# Patient Record
Sex: Female | Born: 1965 | Race: White | Hispanic: No | Marital: Married | Smoking: Never smoker
Health system: Southern US, Community
[De-identification: ages and names within clinical notes are randomized; demographics above are authoritative.]

## PROBLEM LIST (undated history)

## (undated) DIAGNOSIS — R519 Headache, unspecified: Secondary | ICD-10-CM

## (undated) DIAGNOSIS — Z9889 Other specified postprocedural states: Secondary | ICD-10-CM

## (undated) DIAGNOSIS — H348192 Central retinal vein occlusion, unspecified eye, stable: Secondary | ICD-10-CM

## (undated) DIAGNOSIS — R112 Nausea with vomiting, unspecified: Secondary | ICD-10-CM

## (undated) DIAGNOSIS — D649 Anemia, unspecified: Secondary | ICD-10-CM

## (undated) DIAGNOSIS — R51 Headache: Secondary | ICD-10-CM

## (undated) HISTORY — PX: TUBAL LIGATION: SHX77

## (undated) HISTORY — PX: KNEE SURGERY: SHX244

## (undated) HISTORY — PX: DILATION AND CURETTAGE OF UTERUS: SHX78

## (undated) HISTORY — PX: COSMETIC SURGERY: SHX468

## (undated) HISTORY — PX: BREAST SURGERY: SHX581

---

## 2002-09-02 ENCOUNTER — Ambulatory Visit (HOSPITAL_COMMUNITY): Admission: RE | Admit: 2002-09-02 | Discharge: 2002-09-02 | Payer: Self-pay | Admitting: Physical Therapy

## 2002-10-30 ENCOUNTER — Other Ambulatory Visit: Admission: RE | Admit: 2002-10-30 | Discharge: 2002-10-30 | Payer: Self-pay | Admitting: Obstetrics and Gynecology

## 2003-06-02 ENCOUNTER — Other Ambulatory Visit: Admission: RE | Admit: 2003-06-02 | Discharge: 2003-06-02 | Payer: Self-pay | Admitting: Obstetrics and Gynecology

## 2003-10-05 ENCOUNTER — Encounter: Admission: RE | Admit: 2003-10-05 | Discharge: 2003-10-05 | Payer: Self-pay | Admitting: Obstetrics and Gynecology

## 2003-12-11 ENCOUNTER — Inpatient Hospital Stay (HOSPITAL_COMMUNITY): Admission: AD | Admit: 2003-12-11 | Discharge: 2003-12-11 | Payer: Self-pay | Admitting: Obstetrics & Gynecology

## 2003-12-20 ENCOUNTER — Inpatient Hospital Stay (HOSPITAL_COMMUNITY): Admission: RE | Admit: 2003-12-20 | Discharge: 2003-12-23 | Payer: Self-pay | Admitting: Obstetrics and Gynecology

## 2004-01-09 DIAGNOSIS — J189 Pneumonia, unspecified organism: Secondary | ICD-10-CM

## 2004-01-09 HISTORY — DX: Pneumonia, unspecified organism: J18.9

## 2004-01-31 ENCOUNTER — Other Ambulatory Visit: Admission: RE | Admit: 2004-01-31 | Discharge: 2004-01-31 | Payer: Self-pay | Admitting: Obstetrics and Gynecology

## 2004-03-04 ENCOUNTER — Emergency Department (HOSPITAL_COMMUNITY): Admission: EM | Admit: 2004-03-04 | Discharge: 2004-03-04 | Payer: Self-pay | Admitting: Emergency Medicine

## 2005-02-16 ENCOUNTER — Other Ambulatory Visit: Admission: RE | Admit: 2005-02-16 | Discharge: 2005-02-16 | Payer: Self-pay | Admitting: Obstetrics and Gynecology

## 2016-04-21 ENCOUNTER — Emergency Department (HOSPITAL_COMMUNITY): Payer: BLUE CROSS/BLUE SHIELD

## 2016-04-21 ENCOUNTER — Emergency Department (HOSPITAL_COMMUNITY)
Admission: EM | Admit: 2016-04-21 | Discharge: 2016-04-21 | Disposition: A | Discharge status: Home / Self Care | Payer: BLUE CROSS/BLUE SHIELD | Attending: Emergency Medicine | Admitting: Emergency Medicine

## 2016-04-21 ENCOUNTER — Encounter (HOSPITAL_COMMUNITY): Payer: Self-pay | Admitting: *Deleted

## 2016-04-21 DIAGNOSIS — R1030 Lower abdominal pain, unspecified: Secondary | ICD-10-CM | POA: Diagnosis present

## 2016-04-21 DIAGNOSIS — N83201 Unspecified ovarian cyst, right side: Secondary | ICD-10-CM | POA: Diagnosis not present

## 2016-04-21 LAB — COMPREHENSIVE METABOLIC PANEL
ALBUMIN: 3.4 g/dL — AB (ref 3.5–5.0)
ALK PHOS: 79 U/L (ref 38–126)
ALT: 20 U/L (ref 14–54)
AST: 23 U/L (ref 15–41)
Anion gap: 8 (ref 5–15)
BILIRUBIN TOTAL: 0.3 mg/dL (ref 0.3–1.2)
BUN: 10 mg/dL (ref 6–20)
CALCIUM: 8.2 mg/dL — AB (ref 8.9–10.3)
CO2: 24 mmol/L (ref 22–32)
CREATININE: 0.93 mg/dL (ref 0.44–1.00)
Chloride: 104 mmol/L (ref 101–111)
GFR calc Af Amer: >60 mL/min (ref >60)
GFR calc non Af Amer: >60 mL/min (ref >60)
GLUCOSE: 136 mg/dL — AB (ref 65–99)
Potassium: 3.2 mmol/L — ABNORMAL LOW (ref 3.5–5.1)
SODIUM: 136 mmol/L (ref 135–145)
TOTAL PROTEIN: 6.2 g/dL — AB (ref 6.5–8.1)

## 2016-04-21 LAB — URINALYSIS, ROUTINE W REFLEX MICROSCOPIC
Bilirubin Urine: NEGATIVE
GLUCOSE, UA: NEGATIVE mg/dL
Ketones, ur: NEGATIVE mg/dL
Nitrite: NEGATIVE
PROTEIN: NEGATIVE mg/dL
SPECIFIC GRAVITY, URINE: 1.018 (ref 1.005–1.030)
pH: 5 (ref 5.0–8.0)

## 2016-04-21 LAB — LIPASE, BLOOD: Lipase: 34 U/L (ref 11–51)

## 2016-04-21 LAB — CBC
HCT: 35.5 % — ABNORMAL LOW (ref 36.0–46.0)
Hemoglobin: 11.2 g/dL — ABNORMAL LOW (ref 12.0–15.0)
MCH: 27.3 pg (ref 26.0–34.0)
MCHC: 31.5 g/dL (ref 30.0–36.0)
MCV: 86.4 fL (ref 78.0–100.0)
PLATELETS: 393 10*3/uL (ref 150–400)
RBC: 4.11 MIL/uL (ref 3.87–5.11)
RDW: 16 % — AB (ref 11.5–15.5)
WBC: 14.4 10*3/uL — ABNORMAL HIGH (ref 4.0–10.5)

## 2016-04-21 LAB — PREGNANCY, URINE: Preg Test, Ur: NEGATIVE

## 2016-04-21 MED ORDER — POTASSIUM CHLORIDE CRYS ER 20 MEQ PO TBCR
40.0000 meq | EXTENDED_RELEASE_TABLET | Freq: Once | ORAL | Status: AC
  Administered 2016-04-21: 40 meq via ORAL
  Filled 2016-04-21: qty 2

## 2016-04-21 MED ORDER — ONDANSETRON 4 MG PO TBDP
ORAL_TABLET | ORAL | Status: AC
  Filled 2016-04-21: qty 1

## 2016-04-21 MED ORDER — OXYCODONE-ACETAMINOPHEN 5-325 MG PO TABS
ORAL_TABLET | ORAL | Status: AC
  Filled 2016-04-21: qty 1

## 2016-04-21 MED ORDER — OXYCODONE-ACETAMINOPHEN 5-325 MG PO TABS
1.0000 | ORAL_TABLET | ORAL | Status: DC | PRN
  Administered 2016-04-21: 1 via ORAL

## 2016-04-21 MED ORDER — SODIUM CHLORIDE 0.9 % IV BOLUS (SEPSIS)
1000.0000 mL | Freq: Once | INTRAVENOUS | Status: AC
  Administered 2016-04-21: 1000 mL via INTRAVENOUS

## 2016-04-21 MED ORDER — OXYCODONE-ACETAMINOPHEN 5-325 MG PO TABS: 1.0000 | ORAL_TABLET | Freq: Four times a day (QID) | ORAL | 0 refills | Status: DC | PRN

## 2016-04-21 MED ORDER — ONDANSETRON 4 MG PO TBDP
4.0000 mg | ORAL_TABLET | Freq: Once | ORAL | Status: AC
  Administered 2016-04-21: 4 mg via ORAL

## 2016-04-21 NOTE — ED Notes (Signed)
Spoke with laboratory regarding add on pregnancy urine test. Laboratory personnel indicated that they have received the request and are processing now.

## 2016-04-21 NOTE — Discharge Instructions (Signed)
Your Ultrasound shows a 14 cm cyst on your RIGHT ovary. You need an outpatient MRI of your pelvis to better clarify what this is. Your Family doctor or OBGYN can order this. If you develop recurrent or worsening pain in your abdomen, come back to the ER for evaluation

## 2016-04-21 NOTE — ED Provider Notes (Signed)
[facility] DEPT Provider Note   CSN: [id number] Arrival date & time: 04/21/16  0025   By signing my name below, I, Soijett Blue, attest that this documentation has been prepared under the direction and in the presence of Sherwood Gambler, MD. Electronically Signed: Soijett Blue, ED Scribe. 04/21/16. 1:49 AM.  History   Chief Complaint Chief Complaint  Patient presents with  . Abdominal Pain    HPI Kendra Gutierrez is a 51 y.o. female who presents to the Emergency Department complaining of sudden onset, progressively worsening, intermittent, lower abdominal pain since 6:30 PM last night. She notes that her lower abdominal pain radiates to her right groin and her back. Pt states that her lower abdominal pain is alleviated with keeping her right leg bent at a 90 degree angle. Pt reports associated resolved nausea. Pt has not tried any medications for the relief of her symptoms. She states that she ate dinner and felt an urge to use the bathroom with no success prior to the onset of her symptoms. She denies vomiting, diarrhea, constipation, dysuria, hematuria, difficulty urinating, fever, cough, SOB, flank pain, and any other symptoms. Denies past abdominal or gallbladder issues in the past. Pt states that her LMP was 1 month ago and she has had a tubal ligation.      The history is provided by the patient. No language interpreter was used.    History reviewed. No pertinent past medical history.  There are no active problems to display for this patient.   Past Surgical History:  Procedure Laterality Date  . CESAREAN SECTION    . COSMETIC SURGERY      OB History    No data available       Home Medications    Prior to Admission medications   Medication Sig Start Date End Date Taking? Authorizing Provider  oxyCODONE-acetaminophen (PERCOCET) 5-325 MG tablet Take 1 tablet by mouth every 6 (six) hours as needed for severe pain. 04/21/16   Sherwood Gambler, MD    Family History No  family history on file.  Social History Social History  Substance Use Topics  . Smoking status: Never Smoker  . Smokeless tobacco: Never Used  . Alcohol use No     Allergies   Patient has no known allergies.   Review of Systems Review of Systems  Constitutional: Negative for fever.  Respiratory: Negative for cough and shortness of breath.   Gastrointestinal: Positive for abdominal pain (lower) and nausea. Negative for constipation, diarrhea and vomiting.  Genitourinary: Negative for difficulty urinating, dysuria, flank pain and hematuria.  All other systems reviewed and are negative.    Physical Exam Updated Vital Signs BP 129/80   Pulse 64   Temp 98.6 F (37 C) (Oral)   Resp 16   SpO2 97%   Physical Exam  Constitutional: She is oriented to person, place, and time. She appears well-developed and well-nourished.  HENT:  Head: Normocephalic and atraumatic.  Right Ear: External ear normal.  Left Ear: External ear normal.  Nose: Nose normal.  Eyes: Right eye exhibits no discharge. Left eye exhibits no discharge.  Cardiovascular: Normal rate, regular rhythm and normal heart sounds.   Pulmonary/Chest: Effort normal and breath sounds normal.  Abdominal: Soft. There is tenderness in the right lower quadrant, suprapubic area and left lower quadrant. There is no CVA tenderness.  Neurological: She is alert and oriented to person, place, and time.  Skin: Skin is warm and dry.  Nursing note and vitals reviewed.  ED Treatments / Results  DIAGNOSTIC STUDIES: Oxygen Saturation is 100% on RA, nl by my interpretation.    COORDINATION OF CARE: 1:48 AM Discussed treatment plan with pt at bedside which includes labs, UA, CT renal stone study, and pt agreed to plan.   Labs (all labs ordered are listed, but only abnormal results are displayed) Labs Reviewed  COMPREHENSIVE METABOLIC PANEL - Abnormal; Notable for the following:       Result Value   Potassium 3.2 (*)     Glucose, Bld 136 (*)    Calcium 8.2 (*)    Total Protein 6.2 (*)    Albumin 3.4 (*)    All other components within normal limits  CBC - Abnormal; Notable for the following:    WBC 14.4 (*)    Hemoglobin 11.2 (*)    HCT 35.5 (*)    RDW 16.0 (*)    All other components within normal limits  URINALYSIS, ROUTINE W REFLEX MICROSCOPIC - Abnormal; Notable for the following:    APPearance HAZY (*)    Hgb urine dipstick SMALL (*)    Leukocytes, UA SMALL (*)    Bacteria, UA RARE (*)    Squamous Epithelial / LPF 0-5 (*)    All other components within normal limits  LIPASE, BLOOD  PREGNANCY, URINE    EKG  EKG Interpretation None       Radiology US Transvaginal Non-ob  Result Date: 04/21/2016 CLINICAL DATA:  Abdominal pain.  G5 P2.  LMP: 03/17/2016. EXAM: TRANSABDOMINAL AND TRANSVAGINAL ULTRASOUND OF PELVIS DOPPLER ULTRASOUND OF OVARIES TECHNIQUE: Both transabdominal and transvaginal ultrasound examinations of the pelvis were performed. Transabdominal technique was performed for global imaging of the pelvis including uterus, ovaries, adnexal regions, and pelvic cul-de-sac. It was necessary to proceed with endovaginal exam following the transabdominal exam to visualize the ovaries. Color and duplex Doppler ultrasound was utilized to evaluate blood flow to the ovaries. COMPARISON:  CT abdomen pelvis 04/21/2016 FINDINGS: Uterus Measurements: 12.2 x 5.3 x 8.3 cm. There are multiple uterine fibroids. Left fundal fibroid measures 2.4 x 2.2 x 2.6 cm. Right fundal fibroid measures 2.2 x 2.0 x 1.9 cm. Left uterine corpus fibroid measures 4.2 by 4.9 x 4.6 cm. Endometrium Thickness: 12.7 mm.  No focal abnormality visualized. Right ovary Measurements: 5.6 x 5.3 x 4.4 cm. The echotexture of the ovary is heterogeneous. There is a large right adnexal cyst that measures up to 14.1 x 6.2 x 10.4 cm. Left ovary Measurements: 4.6 x 4.2 x 4.3 cm. There are multiple cysts, measuring up to 2.6 x 2.6 x 2.7 cm. Pulsed  Doppler evaluation of both ovaries demonstrates normal low-resistance arterial and venous waveforms. Other findings No abnormal free fluid. IMPRESSION: 1. Heterogeneous appearance of the right ovary with with associated 14 cm adnexal cyst. Nonemergent pelvic MRI with and without contrast is recommended for more complete characterization, as a partially cystic neoplasm is a primary differential consideration. Outpatient surgical referral may also be considered. This recommendation follows the consensus statement: Management of Asymptomatic Ovarian and Other Adnexal Cysts Imaged at [place]: Society of Radiologists in [place]. Radiology 2010; [phone]. 2. No ovarian torsion or other acute pelvic abnormality. 3. Multiple uterine fibroids. Electronically Signed   By: Ulyses Jarred M.D.   On: 04/21/2016 05:49   US Pelvis Complete  Result Date: 04/21/2016 CLINICAL DATA:  Abdominal pain.  G5 P2.  LMP: 03/17/2016. EXAM: TRANSABDOMINAL AND TRANSVAGINAL ULTRASOUND OF PELVIS DOPPLER ULTRASOUND OF OVARIES TECHNIQUE: Both transabdominal and transvaginal  ultrasound examinations of the pelvis were performed. Transabdominal technique was performed for global imaging of the pelvis including uterus, ovaries, adnexal regions, and pelvic cul-de-sac. It was necessary to proceed with endovaginal exam following the transabdominal exam to visualize the ovaries. Color and duplex Doppler ultrasound was utilized to evaluate blood flow to the ovaries. COMPARISON:  CT abdomen pelvis 04/21/2016 FINDINGS: Uterus Measurements: 12.2 x 5.3 x 8.3 cm. There are multiple uterine fibroids. Left fundal fibroid measures 2.4 x 2.2 x 2.6 cm. Right fundal fibroid measures 2.2 x 2.0 x 1.9 cm. Left uterine corpus fibroid measures 4.2 by 4.9 x 4.6 cm. Endometrium Thickness: 12.7 mm.  No focal abnormality visualized. Right ovary Measurements: 5.6 x 5.3 x 4.4 cm. The echotexture of the ovary is heterogeneous. There is a large  right adnexal cyst that measures up to 14.1 x 6.2 x 10.4 cm. Left ovary Measurements: 4.6 x 4.2 x 4.3 cm. There are multiple cysts, measuring up to 2.6 x 2.6 x 2.7 cm. Pulsed Doppler evaluation of both ovaries demonstrates normal low-resistance arterial and venous waveforms. Other findings No abnormal free fluid. IMPRESSION: 1. Heterogeneous appearance of the right ovary with with associated 14 cm adnexal cyst. Nonemergent pelvic MRI with and without contrast is recommended for more complete characterization, as a partially cystic neoplasm is a primary differential consideration. Outpatient surgical referral may also be considered. This recommendation follows the consensus statement: Management of Asymptomatic Ovarian and Other Adnexal Cysts Imaged at [place]: Society of Radiologists in [place]. Radiology 2010; [phone]. 2. No ovarian torsion or other acute pelvic abnormality. 3. Multiple uterine fibroids. Electronically Signed   By: Ulyses Jarred M.D.   On: 04/21/2016 05:49   [place] Art/ven Flow Abd Pelv Doppler  Result Date: 04/21/2016 CLINICAL DATA:  Abdominal pain.  G5 P2.  LMP: 03/17/2016. EXAM: TRANSABDOMINAL AND TRANSVAGINAL ULTRASOUND OF PELVIS DOPPLER ULTRASOUND OF OVARIES TECHNIQUE: Both transabdominal and transvaginal ultrasound examinations of the pelvis were performed. Transabdominal technique was performed for global imaging of the pelvis including uterus, ovaries, adnexal regions, and pelvic cul-de-sac. It was necessary to proceed with endovaginal exam following the transabdominal exam to visualize the ovaries. Color and duplex Doppler ultrasound was utilized to evaluate blood flow to the ovaries. COMPARISON:  CT abdomen pelvis 04/21/2016 FINDINGS: Uterus Measurements: 12.2 x 5.3 x 8.3 cm. There are multiple uterine fibroids. Left fundal fibroid measures 2.4 x 2.2 x 2.6 cm. Right fundal fibroid measures 2.2 x 2.0 x 1.9 cm. Left uterine corpus fibroid measures 4.2 by 4.9  x 4.6 cm. Endometrium Thickness: 12.7 mm.  No focal abnormality visualized. Right ovary Measurements: 5.6 x 5.3 x 4.4 cm. The echotexture of the ovary is heterogeneous. There is a large right adnexal cyst that measures up to 14.1 x 6.2 x 10.4 cm. Left ovary Measurements: 4.6 x 4.2 x 4.3 cm. There are multiple cysts, measuring up to 2.6 x 2.6 x 2.7 cm. Pulsed Doppler evaluation of both ovaries demonstrates normal low-resistance arterial and venous waveforms. Other findings No abnormal free fluid. IMPRESSION: 1. Heterogeneous appearance of the right ovary with with associated 14 cm adnexal cyst. Nonemergent pelvic MRI with and without contrast is recommended for more complete characterization, as a partially cystic neoplasm is a primary differential consideration. Outpatient surgical referral may also be considered. This recommendation follows the consensus statement: Management of Asymptomatic Ovarian and Other Adnexal Cysts Imaged at [place]: Society of Radiologists in [place]. Radiology 2010; [phone]. 2. No ovarian torsion or other acute pelvic  abnormality. 3. Multiple uterine fibroids. Electronically Signed   By: Ulyses Jarred M.D.   On: 04/21/2016 05:49   Ct Renal Stone Study  Result Date: 04/21/2016 CLINICAL DATA:  Acute onset of lower abdominal pain, right greater than left. Right back pain. Initial encounter. EXAM: CT ABDOMEN AND PELVIS WITHOUT CONTRAST TECHNIQUE: Multidetector CT imaging of the abdomen and pelvis was performed following the standard protocol without IV contrast. COMPARISON:  None. FINDINGS: Lower chest: The visualized lung bases are grossly clear. The visualized portions of the mediastinum are unremarkable. Hepatobiliary: The liver is unremarkable in appearance. The gallbladder is unremarkable in appearance. The common bile duct remains normal in caliber. Pancreas: The pancreas is within normal limits. Spleen: The spleen is unremarkable in appearance.  Adrenals/Urinary Tract: The adrenal glands are unremarkable in appearance. The kidneys are within normal limits. There is no evidence of hydronephrosis. No renal or ureteral stones are identified. No perinephric stranding is seen. Stomach/Bowel: The stomach is unremarkable in appearance. The small bowel is within normal limits. The appendix is normal in caliber, without evidence of appendicitis. The colon is unremarkable in appearance. Vascular/Lymphatic: Scattered calcification is seen along the abdominal aorta and its branches. The abdominal aorta is otherwise grossly unremarkable. The inferior vena cava is grossly unremarkable. A retroaortic left renal vein is noted. No retroperitoneal lymphadenopathy is seen. No pelvic sidewall lymphadenopathy is identified. Reproductive: The bladder is mildly distended and within normal limits. The uterus is grossly unremarkable in appearance. The ovaries are relatively symmetric. A 12.7 cm cystic mass is noted likely arising from the left ovary. Other: No additional soft tissue abnormalities are seen. Musculoskeletal: No acute osseous abnormalities are identified. The visualized musculature is unremarkable in appearance. IMPRESSION: 1. 12.7 cm cystic mass noted likely arising from the left ovary. Pelvic ultrasound is recommended for further evaluation. 2. Scattered aortic atherosclerosis. Electronically Signed   By: Garald Balding M.D.   On: 04/21/2016 03:01    Procedures Procedures (including critical care time)  Medications Ordered in ED Medications  oxyCODONE-acetaminophen (PERCOCET/ROXICET) 5-325 MG per tablet 1 tablet (0 tablets Oral Hold 04/21/16 0127)  ondansetron (ZOFRAN-ODT) disintegrating tablet 4 mg (4 mg Oral Given 04/21/16 0036)  sodium chloride 0.9 % bolus 1,000 mL (0 mLs Intravenous Stopped 04/21/16 0328)  potassium chloride SA (K-DUR,KLOR-CON) CR tablet 40 mEq (40 mEq Oral Given 04/21/16 0526)     Initial Impression / Assessment and Plan / ED Course    I have reviewed the triage vital signs and the nursing notes.  Pertinent labs & imaging results that were available during my care of the patient were reviewed by me and considered in my medical decision making (see chart for details).  Clinical Course as of Apr 22 726  Sat Apr 21, 2016  0151 Still has periods, will add on pregnancy test. If negative, will eval with CT renal stone study given acute right sided pain.  [SG]    Clinical Course User Index [SG] Sherwood Gambler, MD    CT shows normal appendix, gallbladder, and no ureteral stone. Pain appears to be coming from this large ovarian cyst. U/s shows the cyst is actually right sided. No torsion, good blood flow. Ever since the PO percocet, pain has been resolved. Given U/S findings, I have stressed need to f/u with OBGYN (she sees Dr Corinna Capra) for outpatient f/u and MRI. Discussed strict return precautions. NSAIDs, Percocet for breakthrough pain. No urinary symptoms, I doubt UTI.  Final Clinical Impressions(s) / ED Diagnoses   Final diagnoses:  Lower abdominal pain  Right ovarian cyst    New Prescriptions Discharge Medication List as of 04/21/2016  6:19 AM    START taking these medications   Details  oxyCODONE-acetaminophen (PERCOCET) 5-325 MG tablet Take 1 tablet by mouth every 6 (six) hours as needed for severe pain., Starting Sat 04/21/2016, Print       I personally performed the services described in this documentation, which was scribed in my presence. The recorded information has been reviewed and is accurate.     Sherwood Gambler, MD 04/21/16 [phone]

## 2016-04-21 NOTE — ED Triage Notes (Addendum)
Pt c/o R sided lower abdominal pain radiating into back and down R groin. Pt denies NV. Pain was a sudden onset earlier tonight

## 2016-04-27 ENCOUNTER — Encounter (HOSPITAL_COMMUNITY): Payer: Self-pay

## 2016-04-27 ENCOUNTER — Encounter (HOSPITAL_COMMUNITY)
Admission: RE | Admit: 2016-04-27 | Discharge: 2016-04-27 | Disposition: A | Discharge status: Home / Self Care | Payer: BLUE CROSS/BLUE SHIELD | Source: Ambulatory Visit | Attending: Obstetrics and Gynecology | Admitting: Obstetrics and Gynecology

## 2016-04-27 DIAGNOSIS — Z01818 Encounter for other preprocedural examination: Secondary | ICD-10-CM | POA: Insufficient documentation

## 2016-04-27 DIAGNOSIS — D259 Leiomyoma of uterus, unspecified: Secondary | ICD-10-CM | POA: Insufficient documentation

## 2016-04-27 DIAGNOSIS — N858 Other specified noninflammatory disorders of uterus: Secondary | ICD-10-CM | POA: Insufficient documentation

## 2016-04-27 HISTORY — DX: Other specified postprocedural states: Z98.890

## 2016-04-27 HISTORY — DX: Headache, unspecified: R51.9

## 2016-04-27 HISTORY — DX: Other specified postprocedural states: R11.2

## 2016-04-27 HISTORY — DX: Headache: R51

## 2016-04-27 HISTORY — DX: Central retinal vein occlusion, unspecified eye, stable: H34.8192

## 2016-04-27 LAB — CBC
HEMATOCRIT: 36.8 % (ref 36.0–46.0)
Hemoglobin: 11.7 g/dL — ABNORMAL LOW (ref 12.0–15.0)
MCH: 27.6 pg (ref 26.0–34.0)
MCHC: 31.8 g/dL (ref 30.0–36.0)
MCV: 86.8 fL (ref 78.0–100.0)
Platelets: 373 10*3/uL (ref 150–400)
RBC: 4.24 MIL/uL (ref 3.87–5.11)
RDW: 16 % — ABNORMAL HIGH (ref 11.5–15.5)
WBC: 8 10*3/uL (ref 4.0–10.5)

## 2016-04-27 LAB — COMPREHENSIVE METABOLIC PANEL
ALBUMIN: 3.5 g/dL (ref 3.5–5.0)
ALT: 14 U/L (ref 14–54)
ANION GAP: 8 (ref 5–15)
AST: 18 U/L (ref 15–41)
Alkaline Phosphatase: 66 U/L (ref 38–126)
BUN: 9 mg/dL (ref 6–20)
CHLORIDE: 103 mmol/L (ref 101–111)
CO2: 26 mmol/L (ref 22–32)
Calcium: 8.9 mg/dL (ref 8.9–10.3)
Creatinine, Ser: 0.71 mg/dL (ref 0.44–1.00)
GFR calc Af Amer: >60 mL/min (ref >60)
GFR calc non Af Amer: >60 mL/min (ref >60)
GLUCOSE: 94 mg/dL (ref 65–99)
POTASSIUM: 3.9 mmol/L (ref 3.5–5.1)
Sodium: 137 mmol/L (ref 135–145)
Total Bilirubin: 0.9 mg/dL (ref 0.3–1.2)
Total Protein: 6.7 g/dL (ref 6.5–8.1)

## 2016-04-27 LAB — TYPE AND SCREEN
ABO/RH(D): O POS
Antibody Screen: NEGATIVE

## 2016-04-27 LAB — ABO/RH: ABO/RH(D): O POS

## 2016-04-27 NOTE — Patient Instructions (Addendum)
Your procedure is scheduled on: Tuesday, April 24  Enter through the [employer] [facility] at: 6 AM  Pick up the phone at the desk and dial [phone].  Call this number if you have problems the morning of surgery: [phone].  Remember: Do NOT eat or drink (including water) after midnight Monday. Take these medicines the morning of surgery with a SIP OF WATER:  None  Do NOT wear jewelry (body piercing), metal hair clips/bobby pins, make-up, or nail polish. Do NOT wear lotions, powders, or perfumes.  You may wear deoderant. Do NOT shave for 48 hours prior to surgery. Do NOT bring valuables to the hospital.  Leave suitcase in car.  After surgery it may be brought to your room.  For patients admitted to the hospital, checkout time is 11:00 AM the day of discharge. Have a responsible adult drive you home.   Home with husband [place] cell [phone].

## 2016-04-30 NOTE — H&P (Addendum)
  Kendra Gutierrez is a 51 year old status post tubal ligation with 3 previous cesarean sections, was seen in the [facility] Emergency Room 2 weeks ago with abdominal pain.  Had a CAT scan and an ultrasound that showed a 14.1 cm right ovarian mass with echotexture that is heterogenous.  She had a normal CA-125.  She also has multiple fibroids measuring 12 to 14 weeks in size and retroverted with the largest fibroid measuring 4.9 cm.  She presents for surgical evaluation of the adnexal mass and hysterectomy due to the fibroids.    O:  On physical exam, heart is regular rate and rhythm.  Lungs are clear to auscultation bilaterally.  Abdomen is soft, nontender, nondistended.  There is no fluid shift.  There is no flank pain or guarding.  You can palpate the right adnexal mass to the mid portion of the umbilicus.  Pelvic exam:  The uterus is retroverted, 12 weeks in size.  No ascites present.  There is minimal tenderness.   A/P:  Large right adnexal mass with a normal CA-125, no evidence of ascites.  Plan to proceed with abdominal hysterectomy and bilateral salpingo-oophorectomy with frozen pathology.  She does wish removal of both ovaries even if it does appear to be benign.  She does know that if it does appear to be cancerous, we will proceed with omentectomy and biopsies.  She may require a second look surgically and may require chemotherapy, but she does want to proceed.  Discussed the procedure at length, its risks, its benefits, its pros, its cons.  All of her questions were answered.   Kendra Shorten, MD  This patient has been seen and examined.   All of her questions were answered.  Labs and vital signs reviewed.  Informed consent has been obtained.  The History and Physical is current. 05/01/16 0715 DL

## 2016-05-01 ENCOUNTER — Encounter (HOSPITAL_COMMUNITY): Payer: Self-pay | Admitting: *Deleted

## 2016-05-01 ENCOUNTER — Inpatient Hospital Stay (HOSPITAL_COMMUNITY): Payer: BLUE CROSS/BLUE SHIELD | Admitting: Anesthesiology

## 2016-05-01 ENCOUNTER — Inpatient Hospital Stay (HOSPITAL_COMMUNITY)
Admission: RE | Admit: 2016-05-01 | Discharge: 2016-05-02 | DRG: 743 | Disposition: A | Discharge status: Home / Self Care | Payer: BLUE CROSS/BLUE SHIELD | Source: Ambulatory Visit | Attending: Obstetrics and Gynecology | Admitting: Obstetrics and Gynecology

## 2016-05-01 ENCOUNTER — Encounter (HOSPITAL_COMMUNITY)
Admission: RE | Disposition: A | Discharge status: Home / Self Care | Payer: Self-pay | Source: Ambulatory Visit | Attending: Obstetrics and Gynecology

## 2016-05-01 DIAGNOSIS — D251 Intramural leiomyoma of uterus: Secondary | ICD-10-CM | POA: Diagnosis present

## 2016-05-01 DIAGNOSIS — R1909 Other intra-abdominal and pelvic swelling, mass and lump: Secondary | ICD-10-CM | POA: Diagnosis present

## 2016-05-01 DIAGNOSIS — Z90710 Acquired absence of both cervix and uterus: Secondary | ICD-10-CM | POA: Diagnosis present

## 2016-05-01 DIAGNOSIS — D259 Leiomyoma of uterus, unspecified: Secondary | ICD-10-CM | POA: Diagnosis present

## 2016-05-01 HISTORY — PX: ABDOMINAL HYSTERECTOMY: SHX81

## 2016-05-01 HISTORY — PX: SALPINGOOPHORECTOMY: SHX82

## 2016-05-01 SURGERY — HYSTERECTOMY, ABDOMINAL
Anesthesia: General | Site: Abdomen

## 2016-05-01 MED ORDER — FENTANYL CITRATE (PF) 250 MCG/5ML IJ SOLN
INTRAMUSCULAR | Status: AC
  Filled 2016-05-01: qty 5

## 2016-05-01 MED ORDER — OXYCODONE-ACETAMINOPHEN 5-325 MG PO TABS
1.0000 | ORAL_TABLET | ORAL | Status: DC | PRN
  Administered 2016-05-02: 1 via ORAL
  Filled 2016-05-01: qty 1

## 2016-05-01 MED ORDER — LACTATED RINGERS IV SOLN
INTRAVENOUS | Status: DC
  Administered 2016-05-01: 125 mL/h via INTRAVENOUS
  Administered 2016-05-01 (×2): via INTRAVENOUS

## 2016-05-01 MED ORDER — MIDAZOLAM HCL 2 MG/2ML IJ SOLN
INTRAMUSCULAR | Status: AC
  Filled 2016-05-01: qty 2

## 2016-05-01 MED ORDER — PROPOFOL 10 MG/ML IV BOLUS
INTRAVENOUS | Status: AC
  Filled 2016-05-01: qty 20

## 2016-05-01 MED ORDER — HYDROMORPHONE HCL 1 MG/ML IJ SOLN
0.2500 mg | INTRAMUSCULAR | Status: DC | PRN
  Administered 2016-05-01: 0.5 mg via INTRAVENOUS
  Administered 2016-05-01 (×2): 0.25 mg via INTRAVENOUS

## 2016-05-01 MED ORDER — DIPHENHYDRAMINE HCL 12.5 MG/5ML PO ELIX: 12.5000 mg | ORAL_SOLUTION | Freq: Four times a day (QID) | ORAL | Status: DC | PRN

## 2016-05-01 MED ORDER — METOCLOPRAMIDE HCL 5 MG/ML IJ SOLN
10.0000 mg | Freq: Once | INTRAMUSCULAR | Status: DC | PRN
Start: 2016-05-01 — End: 2016-05-01

## 2016-05-01 MED ORDER — ONDANSETRON HCL 4 MG/2ML IJ SOLN
4.0000 mg | Freq: Four times a day (QID) | INTRAMUSCULAR | Status: DC | PRN
  Administered 2016-05-01: 4 mg via INTRAVENOUS
  Filled 2016-05-01: qty 2

## 2016-05-01 MED ORDER — GLYCOPYRROLATE 0.2 MG/ML IJ SOLN
INTRAMUSCULAR | Status: DC | PRN
  Administered 2016-05-01: 0.1 mg via INTRAVENOUS

## 2016-05-01 MED ORDER — FENTANYL CITRATE (PF) 100 MCG/2ML IJ SOLN
INTRAMUSCULAR | Status: AC
  Filled 2016-05-01: qty 2

## 2016-05-01 MED ORDER — PROPOFOL 10 MG/ML IV BOLUS
INTRAVENOUS | Status: DC | PRN
  Administered 2016-05-01: 150 mg via INTRAVENOUS

## 2016-05-01 MED ORDER — HYDROMORPHONE HCL 1 MG/ML IJ SOLN
INTRAMUSCULAR | Status: AC
  Administered 2016-05-01: 0.25 mg via INTRAVENOUS
  Filled 2016-05-01: qty 1

## 2016-05-01 MED ORDER — MIDAZOLAM HCL 2 MG/2ML IJ SOLN
INTRAMUSCULAR | Status: DC | PRN
  Administered 2016-05-01: 2 mg via INTRAVENOUS

## 2016-05-01 MED ORDER — ROCURONIUM BROMIDE 100 MG/10ML IV SOLN
INTRAVENOUS | Status: DC | PRN
  Administered 2016-05-01: 30 mg via INTRAVENOUS
  Administered 2016-05-01: 5 mg via INTRAVENOUS
  Administered 2016-05-01: 10 mg via INTRAVENOUS

## 2016-05-01 MED ORDER — IBUPROFEN 600 MG PO TABS
600.0000 mg | ORAL_TABLET | Freq: Four times a day (QID) | ORAL | Status: DC | PRN
  Administered 2016-05-01 – 2016-05-02 (×2): 600 mg via ORAL
  Filled 2016-05-01 (×2): qty 1

## 2016-05-01 MED ORDER — NALOXONE HCL 0.4 MG/ML IJ SOLN: 0.4000 mg | INTRAMUSCULAR | Status: DC | PRN

## 2016-05-01 MED ORDER — HYDROMORPHONE HCL 1 MG/ML IJ SOLN: 0.2000 mg | INTRAMUSCULAR | Status: DC | PRN

## 2016-05-01 MED ORDER — GLYCOPYRROLATE 0.2 MG/ML IJ SOLN
INTRAMUSCULAR | Status: AC
  Filled 2016-05-01: qty 1

## 2016-05-01 MED ORDER — DEXAMETHASONE SODIUM PHOSPHATE 10 MG/ML IJ SOLN
INTRAMUSCULAR | Status: DC | PRN
Start: 2016-05-01 — End: 2016-05-01
  Administered 2016-05-01: 10 mg via INTRAVENOUS

## 2016-05-01 MED ORDER — SUGAMMADEX SODIUM 200 MG/2ML IV SOLN
INTRAVENOUS | Status: AC
  Filled 2016-05-01: qty 2

## 2016-05-01 MED ORDER — DEXAMETHASONE SODIUM PHOSPHATE 10 MG/ML IJ SOLN
INTRAMUSCULAR | Status: AC
  Filled 2016-05-01: qty 1

## 2016-05-01 MED ORDER — ONDANSETRON HCL 4 MG/2ML IJ SOLN
INTRAMUSCULAR | Status: DC | PRN
Start: 2016-05-01 — End: 2016-05-01
  Administered 2016-05-01: 4 mg via INTRAVENOUS

## 2016-05-01 MED ORDER — FENTANYL CITRATE (PF) 100 MCG/2ML IJ SOLN
INTRAMUSCULAR | Status: DC | PRN
  Administered 2016-05-01: 50 ug via INTRAVENOUS
  Administered 2016-05-01: 25 ug via INTRAVENOUS
  Administered 2016-05-01: 50 ug via INTRAVENOUS
  Administered 2016-05-01 (×2): 25 ug via INTRAVENOUS
  Administered 2016-05-01: 50 ug via INTRAVENOUS
  Administered 2016-05-01: 100 ug via INTRAVENOUS
  Administered 2016-05-01: 25 ug via INTRAVENOUS

## 2016-05-01 MED ORDER — DEXTROSE-NACL 5-0.45 % IV SOLN
INTRAVENOUS | Status: DC
  Administered 2016-05-01: 16:00:00 via INTRAVENOUS

## 2016-05-01 MED ORDER — DIPHENHYDRAMINE HCL 50 MG/ML IJ SOLN: 12.5000 mg | Freq: Four times a day (QID) | INTRAMUSCULAR | Status: DC | PRN

## 2016-05-01 MED ORDER — ROCURONIUM BROMIDE 100 MG/10ML IV SOLN
INTRAVENOUS | Status: AC
  Filled 2016-05-01: qty 1

## 2016-05-01 MED ORDER — HYDROMORPHONE HCL 1 MG/ML IJ SOLN
INTRAMUSCULAR | Status: DC | PRN
  Administered 2016-05-01 (×2): 0.5 mg via INTRAVENOUS

## 2016-05-01 MED ORDER — SCOPOLAMINE 1 MG/3DAYS TD PT72
1.0000 | MEDICATED_PATCH | Freq: Once | TRANSDERMAL | Status: DC
  Administered 2016-05-01: 1.5 mg via TRANSDERMAL

## 2016-05-01 MED ORDER — ARTIFICIAL TEARS OPHTHALMIC OINT
TOPICAL_OINTMENT | OPHTHALMIC | Status: AC
  Filled 2016-05-01: qty 3.5

## 2016-05-01 MED ORDER — MEPERIDINE HCL 25 MG/ML IJ SOLN: 6.2500 mg | INTRAMUSCULAR | Status: DC | PRN

## 2016-05-01 MED ORDER — HYDROMORPHONE HCL 1 MG/ML IJ SOLN
INTRAMUSCULAR | Status: AC
  Filled 2016-05-01: qty 1

## 2016-05-01 MED ORDER — LIDOCAINE HCL (CARDIAC) 20 MG/ML IV SOLN
INTRAVENOUS | Status: DC | PRN
  Administered 2016-05-01: 60 mg via INTRAVENOUS

## 2016-05-01 MED ORDER — HYDROMORPHONE 1 MG/ML IV SOLN
INTRAVENOUS | Status: DC
  Administered 2016-05-01: 1.2 mg via INTRAVENOUS
  Administered 2016-05-01: 11:00:00 via INTRAVENOUS
  Filled 2016-05-01: qty 25

## 2016-05-01 MED ORDER — CEFOTETAN DISODIUM-DEXTROSE 2-2.08 GM-% IV SOLR
2.0000 g | INTRAVENOUS | Status: AC
  Administered 2016-05-01: 2 g via INTRAVENOUS

## 2016-05-01 MED ORDER — SUGAMMADEX SODIUM 200 MG/2ML IV SOLN
INTRAVENOUS | Status: DC | PRN
  Administered 2016-05-01: 140 mg via INTRAVENOUS

## 2016-05-01 MED ORDER — CEFOTETAN DISODIUM-DEXTROSE 2-2.08 GM-% IV SOLR
INTRAVENOUS | Status: AC
  Filled 2016-05-01: qty 50

## 2016-05-01 MED ORDER — ALUM & MAG HYDROXIDE-SIMETH 200-200-20 MG/5ML PO SUSP: 30.0000 mL | ORAL | Status: DC | PRN

## 2016-05-01 MED ORDER — SCOPOLAMINE 1 MG/3DAYS TD PT72
MEDICATED_PATCH | TRANSDERMAL | Status: AC
  Administered 2016-05-01: 1.5 mg via TRANSDERMAL
  Filled 2016-05-01: qty 1

## 2016-05-01 MED ORDER — ONDANSETRON HCL 4 MG/2ML IJ SOLN
INTRAMUSCULAR | Status: AC
  Filled 2016-05-01: qty 2

## 2016-05-01 MED ORDER — SODIUM CHLORIDE 0.9% FLUSH: 9.0000 mL | INTRAVENOUS | Status: DC | PRN

## 2016-05-01 MED ORDER — MENTHOL 3 MG MT LOZG: 1.0000 | LOZENGE | OROMUCOSAL | Status: DC | PRN

## 2016-05-01 MED ORDER — SIMETHICONE 80 MG PO CHEW
80.0000 mg | CHEWABLE_TABLET | Freq: Four times a day (QID) | ORAL | Status: DC | PRN
  Administered 2016-05-02: 80 mg via ORAL
  Filled 2016-05-01: qty 1

## 2016-05-01 MED ORDER — LIDOCAINE HCL (CARDIAC) 20 MG/ML IV SOLN
INTRAVENOUS | Status: AC
  Filled 2016-05-01: qty 5

## 2016-05-01 SURGICAL SUPPLY — 43 items
BLADE SURG 10 STRL SS (BLADE) ×6 IMPLANT
CANISTER SUCT 3000ML PPV (MISCELLANEOUS) ×4 IMPLANT
CLOTH BEACON ORANGE TIMEOUT ST (SAFETY) ×4 IMPLANT
DRAPE WARM FLUID 44X44 (DRAPE) ×2 IMPLANT
DRSG OPSITE POSTOP 4X10 (GAUZE/BANDAGES/DRESSINGS) ×4 IMPLANT
DURAPREP 26ML APPLICATOR (WOUND CARE) ×4 IMPLANT
GAUZE SPONGE 4X4 16PLY XRAY LF (GAUZE/BANDAGES/DRESSINGS) IMPLANT
GLOVE BIOGEL PI IND STRL 7.0 (GLOVE) ×6 IMPLANT
GLOVE BIOGEL PI INDICATOR 7.0 (GLOVE) ×6
GLOVE SS BIOGEL STRL SZ 7.5 (GLOVE) IMPLANT
GLOVE SUPERSENSE BIOGEL SZ 7.5 (GLOVE) ×2
GLOVE SURG ORTHO 8.0 STRL STRW (GLOVE) ×4 IMPLANT
GOWN STRL REUS W/TWL LRG LVL3 (GOWN DISPOSABLE) ×12 IMPLANT
LIGASURE IMPACT 36 18CM CVD LR (INSTRUMENTS) ×2 IMPLANT
NEEDLE HYPO 22GX1.5 SAFETY (NEEDLE) IMPLANT
NS IRRIG 1000ML POUR BTL (IV SOLUTION) ×6 IMPLANT
PACK ABDOMINAL GYN (CUSTOM PROCEDURE TRAY) ×2 IMPLANT
PACK ABDOMINAL MINOR (CUSTOM PROCEDURE TRAY) ×2 IMPLANT
PAD OB MATERNITY 4.3X12.25 (PERSONAL CARE ITEMS) ×4 IMPLANT
PENCIL SMOKE EVAC W/HOLSTER (ELECTROSURGICAL) ×4 IMPLANT
PROTECTOR NERVE ULNAR (MISCELLANEOUS) ×4 IMPLANT
RETRACTOR WND ALEXIS 25 LRG (MISCELLANEOUS) IMPLANT
RTRCTR WOUND ALEXIS 25CM LRG (MISCELLANEOUS)
SPONGE LAP 18X18 X RAY DECT (DISPOSABLE) ×2 IMPLANT
STAPLER VISISTAT 35W (STAPLE) IMPLANT
SUT MNCRL 0 MO-4 VIOLET 18 CR (SUTURE) ×2 IMPLANT
SUT MNCRL 0 VIOLET 6X18 (SUTURE) ×2 IMPLANT
SUT MNCRL AB 3-0 PS2 27 (SUTURE) ×2 IMPLANT
SUT MONOCRYL 0 6X18 (SUTURE) ×2
SUT MONOCRYL 0 MO 4 18  CR/8 (SUTURE) ×2
SUT PDS AB 0 CT1 27 (SUTURE) IMPLANT
SUT PDS AB 0 CTX 60 (SUTURE) IMPLANT
SUT PROLENE 2 0 CT 30 (SUTURE) ×2 IMPLANT
SUT VIC AB 0 CT1 18XCR BRD8 (SUTURE) IMPLANT
SUT VIC AB 0 CT1 8-18 (SUTURE)
SUT VIC AB 1 CTX 36 (SUTURE)
SUT VIC AB 1 CTX36XBRD ANBCTRL (SUTURE) IMPLANT
SUT VIC AB 2-0 CT1 (SUTURE) ×4 IMPLANT
SYR CONTROL 10ML LL (SYRINGE) IMPLANT
TOWEL OR 17X24 6PK STRL BLUE (TOWEL DISPOSABLE) ×8 IMPLANT
TRAY FOLEY CATH SILVER 14FR (SET/KITS/TRAYS/PACK) ×4 IMPLANT
TUBING CONNECTOR 18X5MM (MISCELLANEOUS) ×2 IMPLANT
YANKAUER SUCT BULB TIP NO VENT (SUCTIONS) ×2 IMPLANT

## 2016-05-01 NOTE — Brief Op Note (Signed)
05/01/2016  8:49 AM  PATIENT:  Kendra Gutierrez  51 y.o. female  PRE-OPERATIVE DIAGNOSIS:  large 14 cm right adnexal mass, 12 week size fibroids  POST-OPERATIVE DIAGNOSIS:  large 14 cm right adnexal mass, 12 week size fibroids  PROCEDURE:  Procedure(s) with comments: HYSTERECTOMY ABDOMINAL and pelvic washings (N/A) - NEED TO DO FROZEN PATHOLOGY SALPINGO OOPHORECTOMY (Bilateral)  SURGEON:  Surgeon(s) and Role:    * [name], MD - Primary    * W Evette Cristal, MD - Assisting  PHYSICIAN ASSISTANT:   ASSISTANTSNori Riis   ANESTHESIA:   general  EBL:  Total I/O In: 1000 [I.V.:1000] Out: 400 [Urine:100; Blood:300]  BLOOD ADMINISTERED:none  DRAINS: Urinary Catheter (Foley)   LOCAL MEDICATIONS USED:  NONE  SPECIMEN:  Source of Specimen:  uterus, and bilateral tubes and ovaries  DISPOSITION OF SPECIMEN:  PATHOLOGY  COUNTS:  YES  TOURNIQUET:  * No tourniquets in log *  DICTATION: .Other Dictation: Dictation Number 1  PLAN OF CARE: Admit to inpatient   PATIENT DISPOSITION:  PACU - hemodynamically stable.   Delay start of Pharmacological VTE agent (>24hrs) due to surgical blood loss or risk of bleeding: not applicable

## 2016-05-01 NOTE — Transfer of Care (Signed)
Immediate Anesthesia Transfer of Care Note  Patient: Kendra Gutierrez  Procedure(s) Performed: Procedure(s) with comments: HYSTERECTOMY ABDOMINAL and pelvic washings (N/A) - NEED TO DO FROZEN PATHOLOGY SALPINGO OOPHORECTOMY (Bilateral)  Patient Location: PACU  Anesthesia Type:General  Level of Consciousness: awake and patient cooperative  Airway & Oxygen Therapy: Patient Spontanous Breathing and Patient connected to nasal cannula oxygen  Post-op Assessment: Report given to RN and Post -op Vital signs reviewed and stable  Post vital signs: Reviewed and stable  Last Vitals:  Vitals:   05/01/16 0610 05/01/16 0859  BP: (!) 135/99 (!) 159/103  Pulse: 70   Resp: 16   Temp: 36.7 C 36.9 C    Last Pain:  Vitals:   05/01/16 0610  TempSrc: Oral  PainSc: 0-No pain      Patients Stated Pain Goal: 3 (33/74/45 1460)  Complications: No apparent anesthesia complications

## 2016-05-01 NOTE — Anesthesia Procedure Notes (Signed)
Procedure Name: Intubation Date/Time: 05/01/2016 7:40 AM Performed by: Georgeanne Nim Pre-anesthesia Checklist: Patient identified, Timeout performed, Emergency Drugs available, Suction available and Patient being monitored Patient Re-evaluated:Patient Re-evaluated prior to inductionOxygen Delivery Method: Circle system utilized Preoxygenation: Pre-oxygenation with 100% oxygen Intubation Type: IV induction Ventilation: Mask ventilation without difficulty Laryngoscope Size: Mac and 3 Grade View: Grade I Tube type: Oral Tube size: 7.0 mm Number of attempts: 1 Airway Equipment and Method: Stylet Placement Confirmation: ETT inserted through vocal cords under direct vision,  positive ETCO2,  CO2 detector and breath sounds checked- equal and bilateral Secured at: 20 cm Tube secured with: Tape Dental Injury: Teeth and Oropharynx as per pre-operative assessment

## 2016-05-01 NOTE — Anesthesia Postprocedure Evaluation (Signed)
Anesthesia Post Note  Patient: Kendra Gutierrez  Procedure(s) Performed: Procedure(s) (LRB): HYSTERECTOMY ABDOMINAL and pelvic washings (N/A) SALPINGO OOPHORECTOMY (Bilateral)  Patient location during evaluation: [facility] Anesthesia Type: General Level of consciousness: awake and alert and oriented Pain management: pain level controlled Vital Signs Assessment: post-procedure vital signs reviewed and stable Respiratory status: spontaneous breathing, nonlabored ventilation and patient connected to nasal cannula oxygen Cardiovascular status: stable Postop Assessment: no signs of nausea or vomiting and adequate PO intake Anesthetic complications: no        Last Vitals:  Vitals:   05/01/16 1259 05/01/16 1405  BP: 127/79 120/81  Pulse: 70 67  Resp: 13 14  Temp: 36.7 C 36.4 C    Last Pain:  Vitals:   05/01/16 1412  TempSrc:   PainSc: 0-No pain   Pain Goal: Patients Stated Pain Goal: 3 (05/01/16 1050)               Kendra Gutierrez

## 2016-05-01 NOTE — Addendum Note (Signed)
Addendum  created 05/01/16 1427 by Hewitt Blade, CRNA   Sign clinical note

## 2016-05-01 NOTE — Op Note (Signed)
NAMEELLOUISE, MCWHIRTER                  ACCOUNT NO.:  [id number]  MEDICAL RECORD NO.:  [id number]  LOCATION:                                 FACILITY:  PHYSICIAN:  Monia Sabal. Corinna Capra, M.D.    DATE OF BIRTH:  [birth year]  DATE OF PROCEDURE:  05/01/2016 DATE OF DISCHARGE:                              OPERATIVE REPORT   PREOPERATIVE DIAGNOSIS:  Large right adnexal mass, 14-week size fibroids, and pelvic pain.  POSTOPERATIVE DIAGNOSIS:  Large right adnexal mass, 14-week size fibroids, pelvic pain, and serous cystadenoma on frozen pathology.  PROCEDURE:  Total abdominal hysterectomy, bilateral salpingo oophorectomy, and pelvic washings.  SURGEON:  Monia Sabal. Corinna Capra, M.D.  ASSISTANTMaisie Fus, M.D.  ANESTHESIA:  General endotracheal.  INDICATIONS:  Ms. Crutchfield is a 51 year old, 3 previous cesarean sections with tubal ligation presented to the emergency room 2 weeks ago with abdominal pain.  CAT scan showed a 14.1 cm bilateral clear right ovarian mass with a heterogeneous echotexture.  She had a normal CA-125.  She also has a 14-week size fibroids.  The largest fibroid measured 4.9 cm in size.  She presents for surgical evaluation of the adnexal mass and hysterectomy to the fibroids.  The risks and benefits of the procedure were discussed at length and informed consent was obtained.  FINDINGS AT THE TIME OF SURGERY:  Large right adnexal mass consistent with a serous cystadenoma.  Frozen pathology confirmed the above. Normal-appearing appendix.  The uterus was 14-week size consistent with fibroids.  Left ovary appeared to be normal.  PROCEDURE DESCRIPTION:  After adequate analgesia, the patient was placed in the supine position.  She was sterilely prepped and draped.  A Foley catheter was sterilely placed.  She received 2 g of cefotetan preoperatively.  A Pfannenstiel skin incision was made 2 fingerbreadths above the pubic symphysis, taken down sharply.  The fascia was  incised transversely, extended superior and inferiorly off the bellies of rectus muscle, which was separated sharply in the midline.  Peritoneum was entered sharply.  Peritoneal washings were then obtained.  The large adnexal mass was easily delivered into the incision with a partial torsion noted.  LigaSure system was used to ligate across the infundibulopelvic ligament and mesosalpinx removing the right tube and ovary and sent to frozen pathology.  Fredia Sorrow retractor was placed.  The bowel was packed cephalad.  Kelly clamps were placed across the uterine ligaments bilaterally and the uterus was elevated into the incision.  The left round ligament was identified, ligated with LigaSure instrument.  The LigaSure was then used to ligate across the left infundibulopelvic ligament down across the broad ligaments to the uterine artery.  The bladder was then dissected off the anterior surface of the cervix and LigaSure instrument was then used to ligate down across the uterine vessels down to the uterosacral ligament.  The right round ligament was identified, ligated with LigaSure instrument.  After the bladder was dissected off the anterior surface of the cervix, the LigaSure was used to ligate and dissect across the cardinal ligament down to the uterosacral ligament.  Haney clamps were placed across the uterosacral ligaments bilaterally, then across the  vagina and the vagina was then entered and the cervix and uterus were removed intact and sent to pathology.  The eye was sutured, but closed with 0 Monocryl suture. The vagina was then closed in a horizontal fashion using figure-of-eight of 0 Monocryl suture with good approximation.  Good hemostasis was achieved.  Irrigation was applied after copious amount of irrigation with adequate hemostasis.  Reexamination of the pedicles revealed good hemostasis.  Good peristalsis of the ureters bilaterally.  The uterosacral ligaments were  then plicated in the midline.  The packing was then removed.  Fredia Sorrow retractor was then removed.  The peritoneum was then closed with 2-0 Vicryl.  Rectus muscles plicated near the midline.  Hemostasis was achieved with Bovie cautery.  The fascia was then closed in single layer of #1 Vicryl with good approximation, good hemostasis.  The Camper's fascia was reapproximated with 2-0 plain suture and the skin was then closed with 3-0 Monocryl in a subcuticular fashion with good approximation.  Good hemostasis noted. The patient was transferred to the recovery room in stable condition. Sponge and instrument count were normal x3.  Estimated blood loss was less than 300 mL.  The patient received 2 g of cefotetan preoperatively. Intraoperative pathology revealed a serous cystadenoma that appeared to be benign.     Monia Sabal Corinna Capra, M.D.     DCL/MEDQ  D:  05/01/2016  T:  05/01/2016  Job:  [id number]

## 2016-05-01 NOTE — Anesthesia Postprocedure Evaluation (Signed)
Anesthesia Post Note  Patient: Kendra Gutierrez  Procedure(s) Performed: Procedure(s) (LRB): HYSTERECTOMY ABDOMINAL and pelvic washings (N/A) SALPINGO OOPHORECTOMY (Bilateral)  Patient location during evaluation: PACU Anesthesia Type: General Level of consciousness: awake and alert and oriented Pain management: pain level controlled Vital Signs Assessment: post-procedure vital signs reviewed and stable Respiratory status: spontaneous breathing, nonlabored ventilation and respiratory function stable Cardiovascular status: stable and blood pressure returned to baseline Postop Assessment: no signs of nausea or vomiting Anesthetic complications: no        Last Vitals:  Vitals:   05/01/16 1015 05/01/16 1023  BP: (!) 141/86   Pulse: 75 81  Resp: 17 16  Temp:      Last Pain:  Vitals:   05/01/16 1023  TempSrc:   PainSc: 3    Pain Goal: Patients Stated Pain Goal: 3 (05/01/16 0610)               Talina Pleitez A.

## 2016-05-01 NOTE — OR Nursing (Signed)
Specimen sent to pathology labeled right ovary,  Dr. Corinna Capra stated and verified that the specimen was the right ovary. At end of procedure when reverifying all specimens Dr. Corinna Capra stated that the right tube was sent with the ovary.

## 2016-05-01 NOTE — Anesthesia Preprocedure Evaluation (Signed)
Anesthesia Evaluation  Patient identified by MRN, date of birth, ID band Patient awake    Reviewed: Allergy & Precautions, NPO status , Patient's Chart, lab work & pertinent test results  History of Anesthesia Complications (+) PONV and history of anesthetic complications  Airway Mallampati: II  TM Distance: >3 FB Neck ROM: Full    Dental no notable dental hx. (+) Teeth Intact   Pulmonary neg pulmonary ROS,    Pulmonary exam normal breath sounds clear to auscultation       Cardiovascular + Peripheral Vascular Disease  negative cardio ROS Normal cardiovascular exam Rhythm:Regular Rate:Normal     Neuro/Psych  Headaches, Central retinal vein occlusion OD- resolved with injections negative psych ROS   GI/Hepatic negative GI ROS, Neg liver ROS,   Endo/Other  negative endocrine ROS  Renal/GU negative Renal ROS  negative genitourinary   Musculoskeletal negative musculoskeletal ROS (+)   Abdominal   Peds  Hematology  (+) anemia ,   Anesthesia Other Findings   Reproductive/Obstetrics Large right adnexal mass                             Anesthesia Physical Anesthesia Plan  ASA: II  Anesthesia Plan: General   Post-op Pain Management:    Induction: Intravenous  Airway Management Planned: Oral ETT  Additional Equipment:   Intra-op Plan:   Post-operative Plan: Extubation in OR  Informed Consent: I have reviewed the patients History and Physical, chart, labs and discussed the procedure including the risks, benefits and alternatives for the proposed anesthesia with the patient or authorized representative who has indicated his/her understanding and acceptance.   Dental advisory given  Plan Discussed with: CRNA, Anesthesiologist and Surgeon  Anesthesia Plan Comments:         Anesthesia Quick Evaluation

## 2016-05-02 ENCOUNTER — Encounter (HOSPITAL_COMMUNITY): Payer: Self-pay | Admitting: Obstetrics and Gynecology

## 2016-05-02 LAB — CBC
HEMATOCRIT: 30.9 % — AB (ref 36.0–46.0)
HEMOGLOBIN: 10.2 g/dL — AB (ref 12.0–15.0)
MCH: 28.2 pg (ref 26.0–34.0)
MCHC: 33 g/dL (ref 30.0–36.0)
MCV: 85.4 fL (ref 78.0–100.0)
Platelets: 323 10*3/uL (ref 150–400)
RBC: 3.62 MIL/uL — ABNORMAL LOW (ref 3.87–5.11)
RDW: 16 % — ABNORMAL HIGH (ref 11.5–15.5)
WBC: 13.8 10*3/uL — ABNORMAL HIGH (ref 4.0–10.5)

## 2016-05-02 MED ORDER — IBUPROFEN 600 MG PO TABS: 600.0000 mg | ORAL_TABLET | Freq: Four times a day (QID) | ORAL | 0 refills | Status: DC | PRN

## 2016-05-02 MED ORDER — OXYCODONE-ACETAMINOPHEN 5-325 MG PO TABS: 1.0000 | ORAL_TABLET | ORAL | 0 refills | Status: DC | PRN

## 2016-05-02 NOTE — Progress Notes (Signed)
Pt ambulated out teaching complete  At 1430

## 2016-05-02 NOTE — Progress Notes (Signed)
1 Day Post-Op Procedure(s) (LRB): HYSTERECTOMY ABDOMINAL and pelvic washings (N/A) SALPINGO OOPHORECTOMY (Bilateral)  Subjective: Patient reports tolerating PO and no problems voiding.    Objective: I have reviewed patient's vital signs, intake and output, medications and labs.  General: alert, cooperative, appears stated age and no distress GI: soft, non-tender; bowel sounds normal; no masses,  no organomegaly Vaginal Bleeding: none  Assessment: s/p Procedure(s) with comments: HYSTERECTOMY ABDOMINAL and pelvic washings (N/A) - NEED TO DO FROZEN PATHOLOGY SALPINGO OOPHORECTOMY (Bilateral): stable, progressing well and tolerating diet  Plan: Discharge home  LOS: 1 day    Leeah Politano C 05/02/2016, 10:56 AM

## 2016-05-02 NOTE — Discharge Summary (Signed)
Physician Discharge Summary  Patient ID: Kendra Gutierrez MRN: [id number] DOB/AGE: 1965/11/16 51 y.o.  Admit date: 05/01/2016 Discharge date: 05/02/2016  Admission Diagnoses:  Discharge Diagnoses:  Active Problems:   S/P TAH (total abdominal hysterectomy)   Discharged Condition: good  Hospital Course: Pt underwent TAH/BSO with benign intra op pathology.  Her postop course was unremarkable with quick return of bowel and bladder function.  She desires d/c on pod 1   Consults: None  Significant Diagnostic Studies: labs: 10.2  Treatments: surgery: TAH/BSO  Discharge Exam: Blood pressure 109/71, pulse 67, temperature 97.8 F (36.6 C), temperature source Oral, resp. rate 18, height 5\' 6"  (1.676 m), weight 158 lb (71.7 kg), SpO2 97 %. General appearance: alert, cooperative, appears stated age and no distress GI: soft, non-tender; bowel sounds normal; no masses,  no organomegaly Incision/Wound:CD&I  Disposition: 01-Home or Self Care  Discharge Instructions    Call MD for:  difficulty breathing, headache or visual disturbances    Complete by:  As directed    Call MD for:  persistant nausea and vomiting    Complete by:  As directed    Call MD for:  redness, tenderness, or signs of infection (pain, swelling, redness, odor or green/yellow discharge around incision site)    Complete by:  As directed    Call MD for:  severe uncontrolled pain    Complete by:  As directed    Call MD for:  temperature >100.4    Complete by:  As directed    Diet general    Complete by:  As directed    Driving Restrictions    Complete by:  As directed    No driving for 2 weeks   Increase activity slowly    Complete by:  As directed    Lifting restrictions    Complete by:  As directed    No lifting anything greater than 10 pounds (if you have to ask, don't lift it)   Sexual Activity Restrictions    Complete by:  As directed    Nothing in the vagina for 6 weeks     Allergies as of 05/02/2016   No  Known Allergies     Medication List    TAKE these medications   ibuprofen 600 MG tablet Commonly known as:  ADVIL,MOTRIN Take 1 tablet (600 mg total) by mouth every 6 (six) hours as needed (mild pain).   oxyCODONE-acetaminophen 5-325 MG tablet Commonly known as:  PERCOCET Take 1 tablet by mouth every 6 (six) hours as needed for severe pain. What changed:  Another medication with the same name was added. Make sure you understand how and when to take each.   oxyCODONE-acetaminophen 5-325 MG tablet Commonly known as:  PERCOCET/ROXICET Take 1-2 tablets by mouth every 4 (four) hours as needed for severe pain (moderate to severe pain (when tolerating fluids)). What changed:  You were already taking a medication with the same name, and this prescription was added. Make sure you understand how and when to take each.        Signed: Annelies Coyt C 05/02/2016, 10:56 AM

## 2018-09-16 IMAGING — CT CT RENAL STONE PROTOCOL
2 of 4 series · 10 of 46 positions shown, 11 images · non-contrast
Comparison: None.

CLINICAL DATA: Acute onset of lower abdominal pain, right greater
than left. Right back pain. Initial encounter.

EXAM:
CT ABDOMEN AND PELVIS WITHOUT CONTRAST
TECHNIQUE: Multidetector CT imaging of the abdomen and pelvis was performed
following the standard protocol without IV contrast.

[Series 201: stone study, idose (2) · axial · 0.87mm/px · z∈[+47,+432]mm · 7 of 93 slices shown, 8 images]
[im 8/93  soft-tissue]
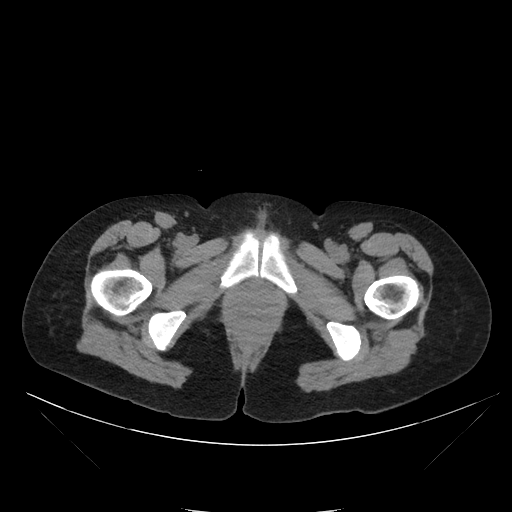
[im 8/93  bone]
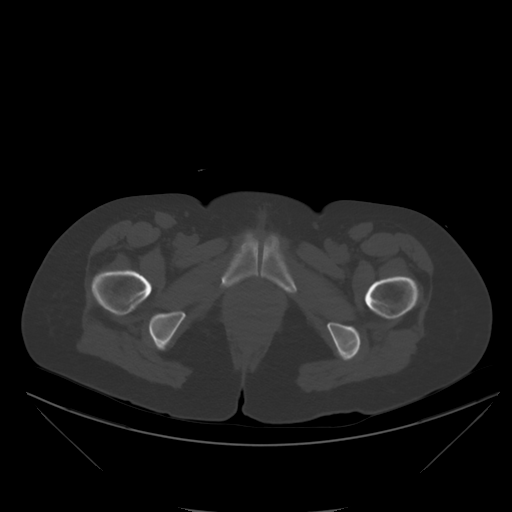
[im 20/93  soft-tissue]
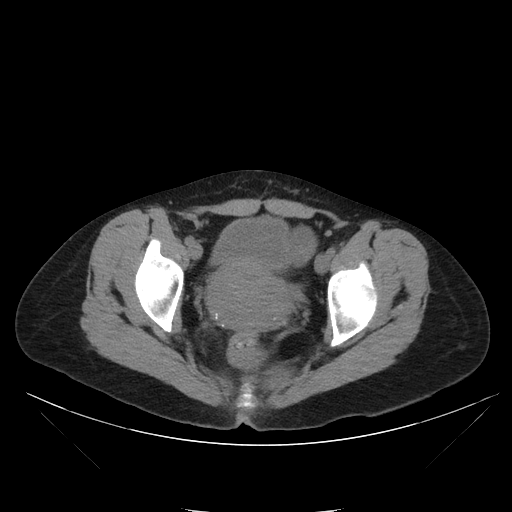
[im 35/93  soft-tissue]
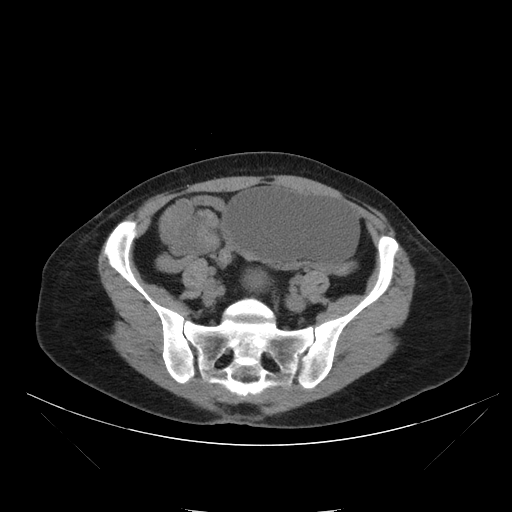
[im 47/93  soft-tissue]
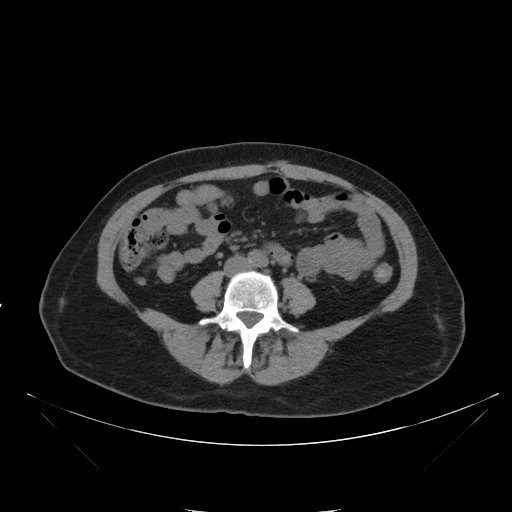
[im 58/93  soft-tissue]
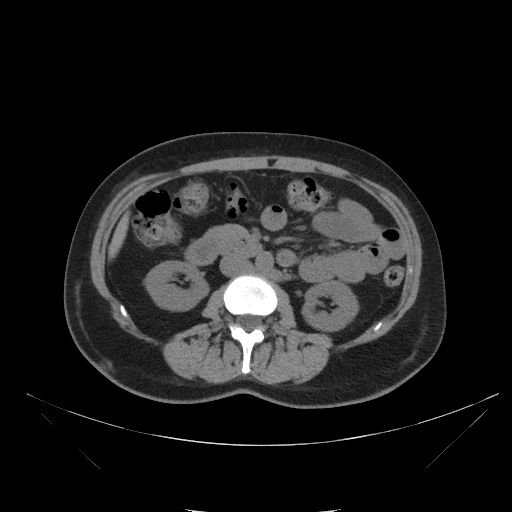
[im 73/93  soft-tissue]
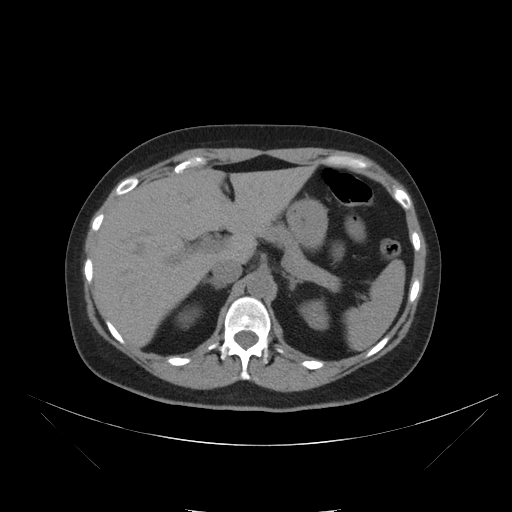
[im 85/93  soft-tissue]
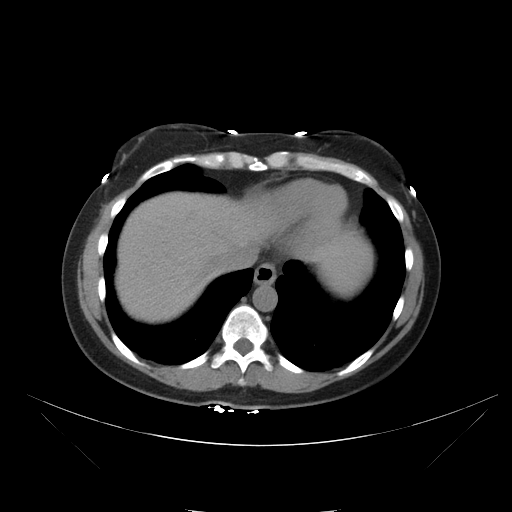

[Series 203: coronals, idose (2) · coronal · 0.45mm/px · 3 of 130 slices shown]
[im 44/130  soft-tissue]
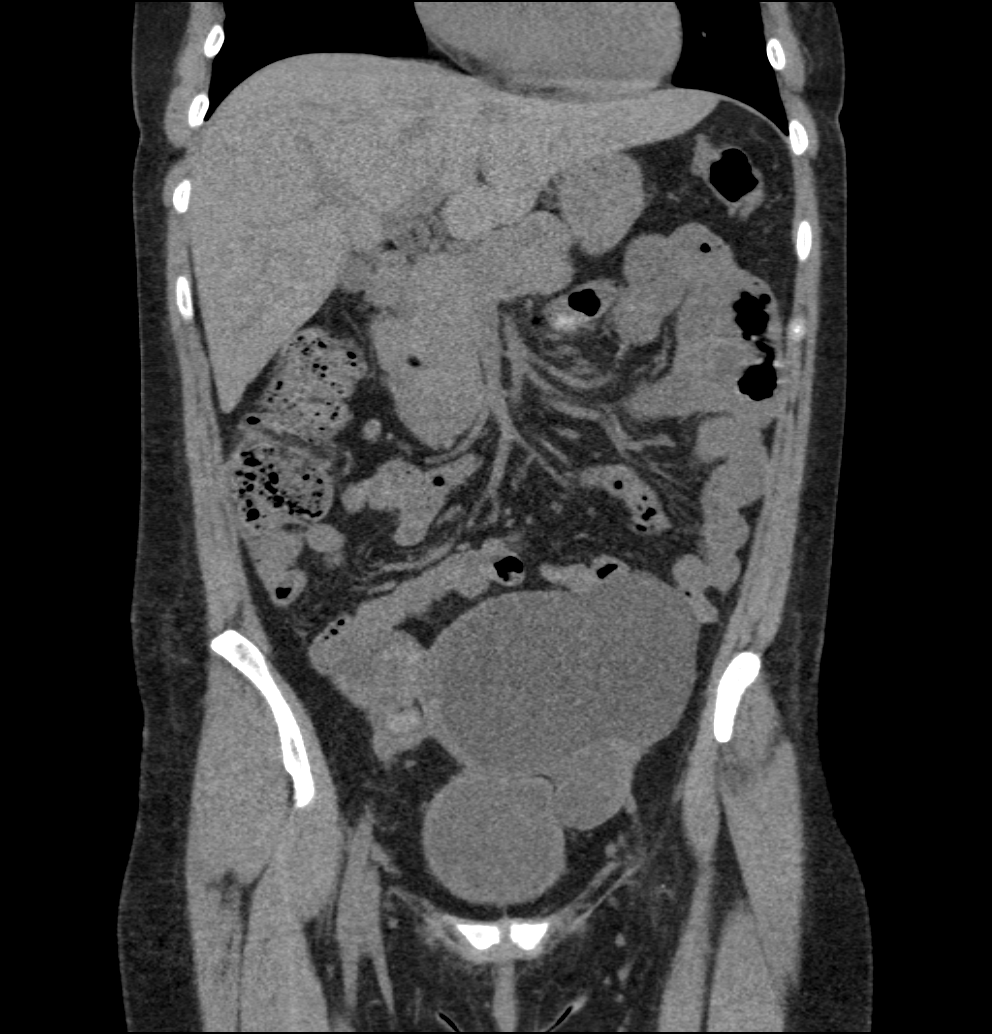
[im 58/130  soft-tissue]
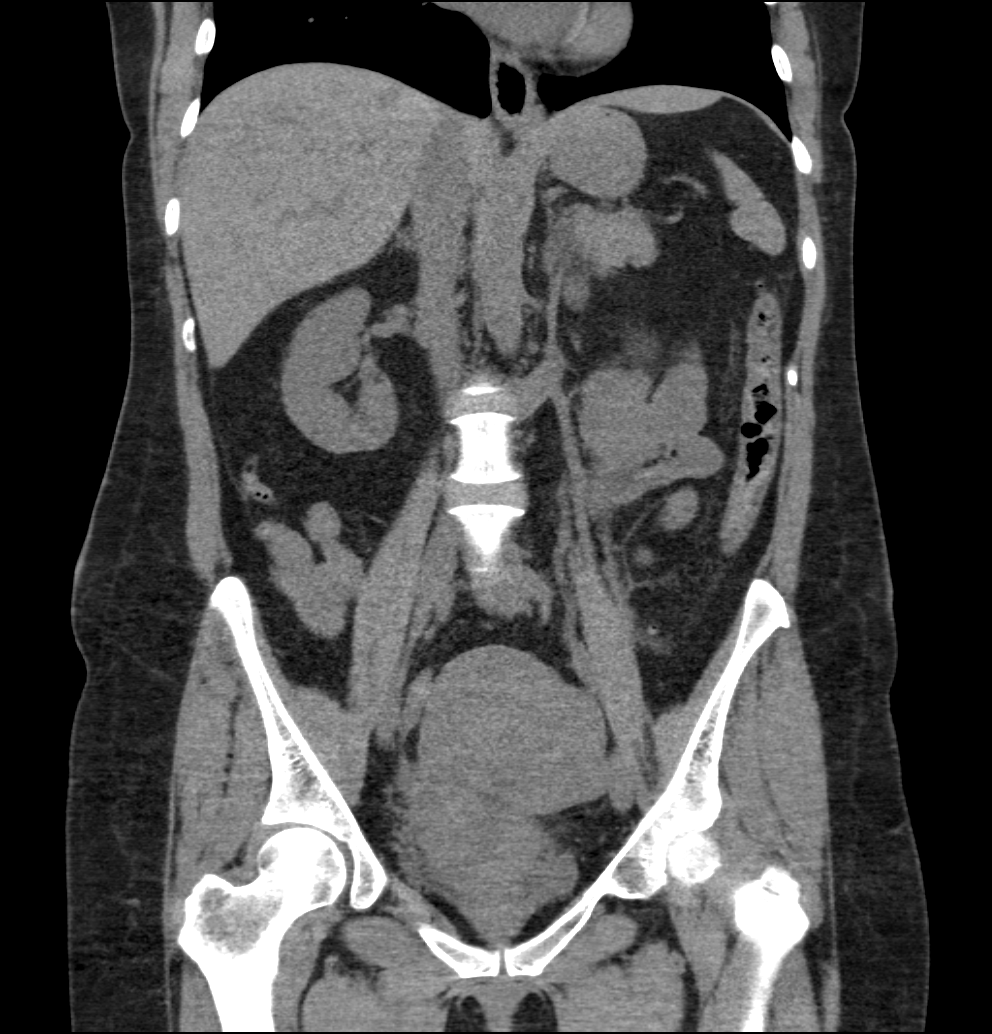
[im 72/130  soft-tissue]
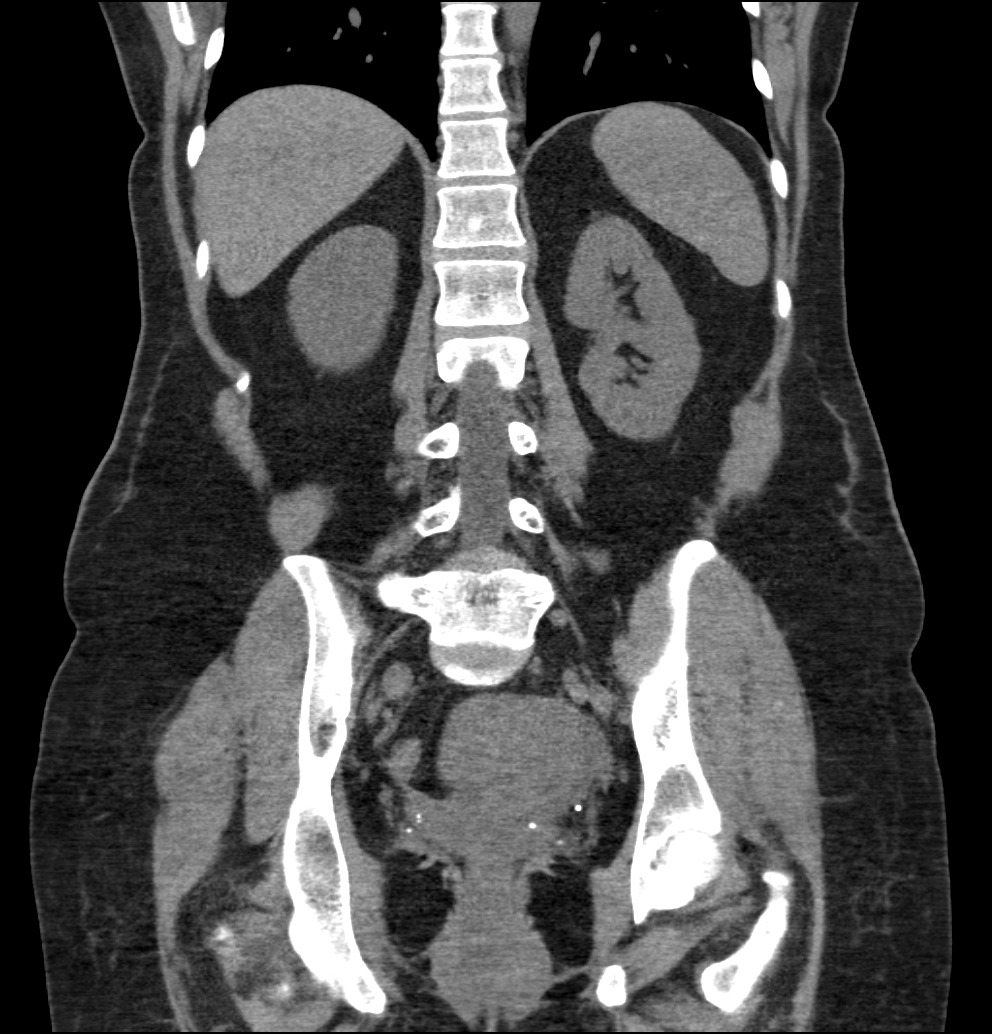

[10 of 46 positions shown; findings below may reference images not displayed]

FINDINGS: Lower chest: The visualized lung bases are grossly clear. The
visualized portions of the mediastinum are unremarkable.

Hepatobiliary: The liver is unremarkable in appearance. The
gallbladder is unremarkable in appearance. The common bile duct
remains normal in caliber.

Pancreas: The pancreas is within normal limits.

Spleen: The spleen is unremarkable in appearance.

Adrenals/Urinary Tract: The adrenal glands are unremarkable in
appearance. The kidneys are within normal limits. There is no
evidence of hydronephrosis. No renal or ureteral stones are
identified. No perinephric stranding is seen.

Stomach/Bowel: The stomach is unremarkable in appearance. The small
bowel is within normal limits. The appendix is normal in caliber,
without evidence of appendicitis. The colon is unremarkable in
appearance.

Vascular/Lymphatic: Scattered calcification is seen along the
abdominal aorta and its branches. The abdominal aorta is otherwise
grossly unremarkable. The inferior vena cava is grossly
unremarkable. A retroaortic left renal vein is noted. No
retroperitoneal lymphadenopathy is seen. No pelvic sidewall
lymphadenopathy is identified.

Reproductive: The bladder is mildly distended and within normal
limits. The uterus is grossly unremarkable in appearance. The
ovaries are relatively symmetric.

A 12.7 cm cystic mass is noted likely arising from the left ovary.

Other: No additional soft tissue abnormalities are seen.

Musculoskeletal: No acute osseous abnormalities are identified. The
visualized musculature is unremarkable in appearance.
IMPRESSION: 1. 12.7 cm cystic mass noted likely arising from the left ovary.
Pelvic ultrasound is recommended for further evaluation.
2. Scattered aortic atherosclerosis.

## 2020-11-29 ENCOUNTER — Ambulatory Visit (INDEPENDENT_AMBULATORY_CARE_PROVIDER_SITE_OTHER): Payer: BC Managed Care – PPO | Admitting: Plastic Surgery

## 2020-11-29 ENCOUNTER — Institutional Professional Consult (permissible substitution): Payer: BLUE CROSS/BLUE SHIELD | Admitting: Plastic Surgery

## 2020-11-29 ENCOUNTER — Other Ambulatory Visit: Payer: Self-pay

## 2020-11-29 VITALS — BP 160/108 | HR 88 | Ht 67.0 in | Wt 166.8 lb

## 2020-11-29 DIAGNOSIS — D489 Neoplasm of uncertain behavior, unspecified: Secondary | ICD-10-CM

## 2020-11-30 NOTE — Progress Notes (Signed)
   Referring Provider Burnard Bunting, MD [address] [place],  [place] [address]   CC:  Lesion behind left earlobe.    Kendra Gutierrez is an 55 y.o. female.  HPI:  55 year old with lesion behind left ear.  Discussed that excision was recommended given the irregular vascular appearance of her lesion.  No Known Allergies  Outpatient Encounter Medications as of 11/29/2020  Medication Sig Note   ibuprofen (ADVIL,MOTRIN) 600 MG tablet Take 1 tablet (600 mg total) by mouth every 6 (six) hours as needed (mild pain).    oxyCODONE-acetaminophen (PERCOCET) 5-325 MG tablet Take 1 tablet by mouth every 6 (six) hours as needed for severe pain. (Patient taking differently: Take 1 tablet by mouth every 6 (six) hours as needed for severe pain.) 04/26/2016: Hasn't had to take a dose at home since Emergency Room visit.  But has if needed.   oxyCODONE-acetaminophen (PERCOCET/ROXICET) 5-325 MG tablet Take 1-2 tablets by mouth every 4 (four) hours as needed for severe pain (moderate to severe pain (when tolerating fluids)).    No facility-administered encounter medications on file as of 11/29/2020.     Past Medical History:  Diagnosis Date   CRVO (central retinal vein occlusion)    right eye, treated x 3 yrs with eylea injections, checkup 1st week April - resolved all cleared per patient   Headache    last on in Jan 2018    PONV (postoperative nausea and vomiting)     Past Surgical History:  Procedure Laterality Date   ABDOMINAL HYSTERECTOMY N/A 05/01/2016   Procedure: HYSTERECTOMY ABDOMINAL and pelvic washings;  Surgeon: [name], MD;  Location: [facility];  Service: Gynecology;  Laterality: N/A;  NEED TO DO [facility]EN PATHOLOGY   BREAST SURGERY     breast reduction   CESAREAN SECTION     x 3   COSMETIC SURGERY     tummy tuck   DILATION AND CURETTAGE OF UTERUS     x 2 MAB   KNEE SURGERY Right    SALPINGOOPHORECTOMY Bilateral 05/01/2016   Procedure: SALPINGO OOPHORECTOMY;  Surgeon: [name], MD;   Location: [facility];  Service: Gynecology;  Laterality: Bilateral;   TUBAL LIGATION      No family history on file.  Social History   Social History Narrative   Not on file     Review of Systems General: Denies fevers, chills, weight loss CV: Denies chest pain, shor2tness of breath, palpitations   Physical Exam Vitals with BMI 11/29/2020 05/02/2016 05/02/2016  Height 5\' 7"  - -  Weight 166 lbs 13 oz - -  BMI 47.09 - -  Systolic 628 366 294  Diastolic 765 98 73  Pulse 88 - 78    General:  No acute distress,  Alert and oriented, Non-Toxic, Normal speech and affect Heent:  2 cm vascular lesion behind left earlobe   Assessment/Plan Excision indicated, will schedule  Time based coding: 16 minutes were spent with the patient.  Greater than 50% was spent on counseling cordination of care.  We discussed excision of the lesion and possible facial nerve in the region to prevent deep exploration.   Lennice Sites 11/30/2020, 4:28 PM

## 2021-01-10 NOTE — Progress Notes (Signed)
Patient ID: Kendra Gutierrez, female    DOB: 1965-11-10, 56 y.o.   MRN: [id number]  Chief Complaint  Patient presents with   Pre-op Exam      ICD-10-CM   1. Neoplasm, uncertain whether benign or malignant  D48.9        History of Present Illness: Kendra Gutierrez is a 56 y.o.  female  with a history of left ear lesion.  She presents for preoperative evaluation for upcoming procedure, left ear lesion excision with possible excision of facial nerve locally, scheduled for 01/24/2021 with Dr.  Erin Hearing .  The patient has not had problems with anesthesia aside from PONV.  Patient tells me that the lesion behind her ear was biopsied and negative for malignancy.  She is on low-dose Estrace for hot flashes and perimenopausal symptoms due to her total hysterectomy in 2018.  Discussed possibly holding the low-dose Estrace for optimal DVT mitigation, but she is otherwise entirely low risk and this will likely be a relatively expeditious surgery.  She denies any personal history of cancer.  No personal or family history of blood clots or clotting disorder.  She did curiously have central retinal vein occlusion years ago, but no signs recurrence or other history of clotting.  She denies any tobacco use.  She understands the possibility of facial nerve involvement during excision.  Low dose estrace for perimenopausal symptoms since her hysterectomy. Hx of CRVO.    Summary of Previous Visit: Patient was seen for initial consult of left ear lesion 11/29/2020 by Dr. Erin Hearing.  Excision was recommended given the irregular vascular appearance.  They also discussed possibility of local facial nerve excision.  Patient expressed continued interest in surgical intervention.  Job: N/A  PMH Significant for: Left ear lesion, CRVO, hysterectomy 2018.   Past Medical History: Allergies: No Known Allergies  Current Medications:  Current Outpatient Medications:    estradiol (ESTRACE) 2 MG tablet, Take 2 mg by mouth  daily., Disp: , Rfl:   Past Medical Problems: Past Medical History:  Diagnosis Date   CRVO (central retinal vein occlusion)    right eye, treated x 3 yrs with eylea injections, checkup 1st week April - resolved all cleared per patient   Headache    last on in Jan 2018    PONV (postoperative nausea and vomiting)     Past Surgical History: Past Surgical History:  Procedure Laterality Date   ABDOMINAL HYSTERECTOMY N/A 05/01/2016   Procedure: HYSTERECTOMY ABDOMINAL and pelvic washings;  Surgeon: [name], MD;  Location: [facility];  Service: Gynecology;  Laterality: N/A;  NEED TO DO FROZEN PATHOLOGY   BREAST SURGERY     breast reduction   CESAREAN SECTION     x 3   COSMETIC SURGERY     tummy tuck   DILATION AND CURETTAGE OF UTERUS     x 2 MAB   KNEE SURGERY Right    SALPINGOOPHORECTOMY Bilateral 05/01/2016   Procedure: SALPINGO OOPHORECTOMY;  Surgeon: [name], MD;  Location: [facility];  Service: Gynecology;  Laterality: Bilateral;   TUBAL LIGATION      Social History: Social History   Socioeconomic History   Marital status: Married    Spouse name: Not on file   Number of children: Not on file   Years of education: Not on file   Highest education level: Not on file  Occupational History   Not on file  Tobacco Use   Smoking status: Never   Smokeless tobacco:  Never  Substance and Sexual Activity   Alcohol use: No   Drug use: No   Sexual activity: Yes    Birth control/protection: Surgical  Other Topics Concern   Not on file  Social History Narrative   Not on file   Social Determinants of Health   Financial Resource Strain: Not on file  Food Insecurity: Not on file  Transportation Needs: Not on file  Physical Activity: Not on file  Stress: Not on file  Social Connections: Not on file  Intimate Partner Violence: Not on file    Family History: No family history on file.  Review of Systems: ROS Denies any recent illness, infection, traumas, or  hospitalizations.  Physical Exam: Vital Signs BP (!) 166/98 (BP Location: Left Arm, Patient Position: Sitting, Cuff Size: Small)    Pulse 80    Ht 5\' 7"  (1.702 m)    Wt 158 lb 3.2 oz (71.8 kg)    SpO2 96%    BMI 24.78 kg/m   Physical Exam Constitutional:      General: Not in acute distress.    Appearance: Normal appearance. Not ill-appearing.  HENT:     Head: Normocephalic and atraumatic.  Eyes:     Pupils: Pupils are equal, round. Cardiovascular:     Rate and Rhythm: Normal rate.    Pulses: Normal pulses.  Pulmonary:     Effort: No respiratory distress or increased work of breathing.  Speaks in full sentences. Abdominal:     General: Abdomen is flat. No distension.   Musculoskeletal: Normal range of motion. No lower extremity swelling or edema. No varicosities.  Skin:    General: Skin is warm and dry.     Findings: No erythema or rash.  Neurological:     Mental Status: Alert and oriented to person, place, and time.  Psychiatric:        Mood and Affect: Mood normal.        Behavior: Behavior normal.    Assessment/Plan: The patient is scheduled for left ear lesion excision 01/24/2021 with Dr. Erin Hearing.  Risks, benefits, and alternatives of procedure discussed, questions answered and consent obtained.    Smoking Status: Non-smoker.  Caprini Score: 3; Risk Factors include: Age, estrogen replacement, and length of planned surgery. Recommendation for mechanical prophylaxis. Encourage early ambulation.   Pictures obtained: Obtained today.  Post-op Rx sent to pharmacy: Oxycodone, Zofran.  Patient was provided with the General Surgical Risk consent document and Pain Medication Agreement prior to their appointment.  They had adequate time to read through the risk consent documents and Pain Medication Agreement. We also discussed them in person together during this preop appointment. All of their questions were answered to their satisfaction.  Recommended calling if they have any  further questions.  Risk consent form and Pain Medication Agreement to be scanned into patient's chart.    Electronically signed by: Krista Blue, PA-C 01/12/2021 4:21 PM

## 2021-01-10 NOTE — H&P (View-Only) (Signed)
Patient ID: Kendra Gutierrez, female    DOB: [birth year], 56 y.o.   MRN: [id number]  Chief Complaint  Patient presents with   Pre-op Exam      ICD-10-CM   1. Neoplasm, uncertain whether benign or malignant  D48.9        History of Present Illness: Kendra Gutierrez is a 56 y.o.  female  with a history of left ear lesion.  She presents for preoperative evaluation for upcoming procedure, left ear lesion excision with possible excision of facial nerve locally, scheduled for 01/24/2021 with Dr.  Erin Hearing .  The patient has not had problems with anesthesia aside from PONV.  Patient tells me that the lesion behind her ear was biopsied and negative for malignancy.  She is on low-dose Estrace for hot flashes and perimenopausal symptoms due to her total hysterectomy in 2018.  Discussed possibly holding the low-dose Estrace for optimal DVT mitigation, but she is otherwise entirely low risk and this will likely be a relatively expeditious surgery.  She denies any personal history of cancer.  No personal or family history of blood clots or clotting disorder.  She did curiously have central retinal vein occlusion years ago, but no signs recurrence or other history of clotting.  She denies any tobacco use.  She understands the possibility of facial nerve involvement during excision.  Low dose estrace for perimenopausal symptoms since her hysterectomy. Hx of CRVO.    Summary of Previous Visit: Patient was seen for initial consult of left ear lesion 11/29/2020 by Dr. Erin Hearing.  Excision was recommended given the irregular vascular appearance.  They also discussed possibility of local facial nerve excision.  Patient expressed continued interest in surgical intervention.  Job: N/A  PMH Significant for: Left ear lesion, CRVO, hysterectomy 2018.   Past Medical History: Allergies: No Known Allergies  Current Medications:  Current Outpatient Medications:    estradiol (ESTRACE) 2 MG tablet, Take 2 mg by mouth  daily., Disp: , Rfl:   Past Medical Problems: Past Medical History:  Diagnosis Date   CRVO (central retinal vein occlusion)    right eye, treated x 3 yrs with eylea injections, checkup 1st week April - resolved all cleared per patient   Headache    last on in Jan 2018    PONV (postoperative nausea and vomiting)     Past Surgical History: Past Surgical History:  Procedure Laterality Date   ABDOMINAL HYSTERECTOMY N/A 05/01/2016   Procedure: HYSTERECTOMY ABDOMINAL and pelvic washings;  Surgeon: [name], MD;  Location: [facility];  Service: Gynecology;  Laterality: N/A;  NEED TO DO [facility]   BREAST SURGERY     breast reduction   CESAREAN SECTION     x 3   COSMETIC SURGERY     tummy tuck   DILATION AND CURETTAGE OF UTERUS     x 2 MAB   KNEE SURGERY Right    SALPINGOOPHORECTOMY Bilateral 05/01/2016   Procedure: SALPINGO OOPHORECTOMY;  Surgeon: [name], MD;  Location: [facility];  Service: Gynecology;  Laterality: Bilateral;   TUBAL LIGATION      Social History: Social History   Socioeconomic History   Marital status: Married    Spouse name: Not on file   Number of children: Not on file   Years of education: Not on file   Highest education level: Not on file  Occupational History   Not on file  Tobacco Use   Smoking status: Never   Smokeless tobacco:  Never  Substance and Sexual Activity   Alcohol use: No   Drug use: No   Sexual activity: Yes    Birth control/protection: Surgical  Other Topics Concern   Not on file  Social History Narrative   Not on file   Social Determinants of Health   Financial Resource Strain: Not on file  Food Insecurity: Not on file  Transportation Needs: Not on file  Physical Activity: Not on file  Stress: Not on file  Social Connections: Not on file  Intimate Partner Violence: Not on file    Family History: No family history on file.  Review of Systems: ROS Denies any recent illness, infection, traumas, or  hospitalizations.  Physical Exam: Vital Signs BP (!) 166/98 (BP Location: Left Arm, Patient Position: Sitting, Cuff Size: Small)    Pulse 80    Ht 5\' 7"  (1.702 m)    Wt 158 lb 3.2 oz (71.8 kg)    SpO2 96%    BMI 24.78 kg/m   Physical Exam Constitutional:      General: Not in acute distress.    Appearance: Normal appearance. Not ill-appearing.  HENT:     Head: Normocephalic and atraumatic.  Eyes:     Pupils: Pupils are equal, round. Cardiovascular:     Rate and Rhythm: Normal rate.    Pulses: Normal pulses.  Pulmonary:     Effort: No respiratory distress or increased work of breathing.  Speaks in full sentences. Abdominal:     General: Abdomen is flat. No distension.   Musculoskeletal: Normal range of motion. No lower extremity swelling or edema. No varicosities.  Skin:    General: Skin is warm and dry.     Findings: No erythema or rash.  Neurological:     Mental Status: Alert and oriented to person, place, and time.  Psychiatric:        Mood and Affect: Mood normal.        Behavior: Behavior normal.    Assessment/Plan: The patient is scheduled for left ear lesion excision 01/24/2021 with Dr. Erin Hearing.  Risks, benefits, and alternatives of procedure discussed, questions answered and consent obtained.    Smoking Status: Non-smoker.  Caprini Score: 3; Risk Factors include: Age, estrogen replacement, and length of planned surgery. Recommendation for mechanical prophylaxis. Encourage early ambulation.   Pictures obtained: Obtained today.  Post-op Rx sent to pharmacy: Oxycodone, Zofran.  Patient was provided with the General Surgical Risk consent document and Pain Medication Agreement prior to their appointment.  They had adequate time to read through the risk consent documents and Pain Medication Agreement. We also discussed them in person together during this preop appointment. All of their questions were answered to their satisfaction.  Recommended calling if they have any  further questions.  Risk consent form and Pain Medication Agreement to be scanned into patient's chart.    Electronically signed by: Krista Blue, PA-C 01/12/2021 4:21 PM

## 2021-01-12 ENCOUNTER — Ambulatory Visit (INDEPENDENT_AMBULATORY_CARE_PROVIDER_SITE_OTHER): Payer: BC Managed Care – PPO | Admitting: Physician Assistant

## 2021-01-12 ENCOUNTER — Other Ambulatory Visit: Payer: Self-pay

## 2021-01-12 VITALS — BP 166/98 | HR 80 | Ht 67.0 in | Wt 158.2 lb

## 2021-01-12 DIAGNOSIS — D489 Neoplasm of uncertain behavior, unspecified: Secondary | ICD-10-CM

## 2021-01-12 MED ORDER — ONDANSETRON 4 MG PO TBDP: 4.0000 mg | ORAL_TABLET | Freq: Three times a day (TID) | ORAL | 0 refills | Status: DC | PRN

## 2021-01-12 MED ORDER — OXYCODONE HCL 5 MG PO TABS: 5.0000 mg | ORAL_TABLET | Freq: Four times a day (QID) | ORAL | 0 refills | Status: AC | PRN

## 2021-01-12 NOTE — Addendum Note (Signed)
Addended by: Krista Blue on: 01/12/2021 04:23 PM   Modules accepted: Orders

## 2021-01-18 NOTE — Telephone Encounter (Signed)
See msg

## 2021-01-23 ENCOUNTER — Other Ambulatory Visit: Payer: Self-pay

## 2021-01-23 ENCOUNTER — Encounter (HOSPITAL_COMMUNITY): Payer: Self-pay | Admitting: *Deleted

## 2021-01-23 NOTE — Progress Notes (Signed)
Kendra Gutierrez denies chest pain or shortness of breath. Patient denies having any s/s of Covid in her household.  Patient denies any known exposure to Covid.   PCP is Dr. Burnard Bunting.  I instructed Kendra Gutierrez to shower with antibiotic soap, if it is available.  Dry off with a clean towel. Do not put lotion, powder, cologne or deodorant or makeup.No jewelry or piercings. Men may shave their face and neck. Woman should not shave. No nail polish, artificial or acrylic nails. Wear clean clothes, brush your teeth. Glasses, contact lens,dentures or partials may not be worn in the OR. If you need to wear them, please bring a case for glasses, do not wear contacts or bring a case, the hospital does not have contact cases, dentures or partials will have to be removed , make sure they are clean, we will provide a denture cup to put them in. You will need some one to drive you home and a responsible person over the age of 21 to stay with you for the first 24 hours after surgery.

## 2021-01-24 ENCOUNTER — Ambulatory Visit (HOSPITAL_COMMUNITY): Payer: BC Managed Care – PPO | Admitting: Certified Registered"

## 2021-01-24 ENCOUNTER — Ambulatory Visit (HOSPITAL_COMMUNITY)
Admission: RE | Admit: 2021-01-24 | Discharge: 2021-01-24 | Disposition: A | Discharge status: Home / Self Care | Payer: BC Managed Care – PPO | Attending: Plastic Surgery | Admitting: Plastic Surgery

## 2021-01-24 ENCOUNTER — Encounter (HOSPITAL_COMMUNITY)
Admission: RE | Disposition: A | Discharge status: Home / Self Care | Payer: Self-pay | Source: Home / Self Care | Attending: Plastic Surgery

## 2021-01-24 DIAGNOSIS — D1801 Hemangioma of skin and subcutaneous tissue: Secondary | ICD-10-CM | POA: Insufficient documentation

## 2021-01-24 DIAGNOSIS — Z87898 Personal history of other specified conditions: Secondary | ICD-10-CM | POA: Insufficient documentation

## 2021-01-24 DIAGNOSIS — D649 Anemia, unspecified: Secondary | ICD-10-CM | POA: Diagnosis not present

## 2021-01-24 DIAGNOSIS — Z79890 Hormone replacement therapy (postmenopausal): Secondary | ICD-10-CM | POA: Diagnosis not present

## 2021-01-24 HISTORY — DX: Anemia, unspecified: D64.9

## 2021-01-24 HISTORY — PX: LESION EXCISION WITH COMPLEX REPAIR: SHX6700

## 2021-01-24 LAB — CBC
HCT: 40.1 % (ref 36.0–46.0)
Hemoglobin: 13.8 g/dL (ref 12.0–15.0)
MCH: 31.9 pg (ref 26.0–34.0)
MCHC: 34.4 g/dL (ref 30.0–36.0)
MCV: 92.8 fL (ref 80.0–100.0)
Platelets: 346 10*3/uL (ref 150–400)
RBC: 4.32 MIL/uL (ref 3.87–5.11)
RDW: 12.3 % (ref 11.5–15.5)
WBC: 7.6 10*3/uL (ref 4.0–10.5)
nRBC: 0 % (ref 0.0–0.2)

## 2021-01-24 SURGERY — LESION EXCISION WITH COMPLEX REPAIR
Anesthesia: General | Site: Ear | Laterality: Left

## 2021-01-24 MED ORDER — MIDAZOLAM HCL 2 MG/2ML IJ SOLN
INTRAMUSCULAR | Status: AC
  Filled 2021-01-24: qty 2

## 2021-01-24 MED ORDER — ACETAMINOPHEN 10 MG/ML IV SOLN: 1000.0000 mg | Freq: Once | INTRAVENOUS | Status: DC | PRN

## 2021-01-24 MED ORDER — ONDANSETRON HCL 4 MG/2ML IJ SOLN
INTRAMUSCULAR | Status: AC
  Filled 2021-01-24: qty 2

## 2021-01-24 MED ORDER — DEXAMETHASONE SODIUM PHOSPHATE 10 MG/ML IJ SOLN
INTRAMUSCULAR | Status: AC
  Filled 2021-01-24: qty 1

## 2021-01-24 MED ORDER — CHLORHEXIDINE GLUCONATE CLOTH 2 % EX PADS: 6.0000 | MEDICATED_PAD | Freq: Once | CUTANEOUS | Status: DC

## 2021-01-24 MED ORDER — LIDOCAINE HCL 1 % IJ SOLN
INTRAMUSCULAR | Status: AC
  Filled 2021-01-24: qty 20

## 2021-01-24 MED ORDER — CHLORHEXIDINE GLUCONATE 0.12 % MT SOLN
15.0000 mL | Freq: Once | OROMUCOSAL | Status: AC
  Administered 2021-01-24: 15 mL via OROMUCOSAL
  Filled 2021-01-24: qty 15

## 2021-01-24 MED ORDER — FENTANYL CITRATE (PF) 100 MCG/2ML IJ SOLN
INTRAMUSCULAR | Status: DC | PRN
Start: 2021-01-24 — End: 2021-01-24
  Administered 2021-01-24 (×2): 50 ug via INTRAVENOUS

## 2021-01-24 MED ORDER — ACETAMINOPHEN 500 MG PO TABS: 1000.0000 mg | ORAL_TABLET | Freq: Once | ORAL | Status: DC | PRN

## 2021-01-24 MED ORDER — ORAL CARE MOUTH RINSE: 15.0000 mL | Freq: Once | OROMUCOSAL | Status: AC

## 2021-01-24 MED ORDER — BACITRACIN ZINC 500 UNIT/GM EX OINT
TOPICAL_OINTMENT | CUTANEOUS | Status: AC
  Filled 2021-01-24: qty 28.35

## 2021-01-24 MED ORDER — LIDOCAINE-EPINEPHRINE 1 %-1:100000 IJ SOLN
INTRAMUSCULAR | Status: DC | PRN
  Administered 2021-01-24: 5 mL

## 2021-01-24 MED ORDER — PROPOFOL 10 MG/ML IV BOLUS
INTRAVENOUS | Status: DC | PRN
  Administered 2021-01-24: 180 mg via INTRAVENOUS

## 2021-01-24 MED ORDER — LIDOCAINE 2% (20 MG/ML) 5 ML SYRINGE
INTRAMUSCULAR | Status: DC | PRN
Start: 2021-01-24 — End: 2021-01-24
  Administered 2021-01-24: 60 mg via INTRAVENOUS

## 2021-01-24 MED ORDER — LACTATED RINGERS IV SOLN: INTRAVENOUS | Status: DC

## 2021-01-24 MED ORDER — OXYCODONE HCL 5 MG/5ML PO SOLN: 5.0000 mg | Freq: Once | ORAL | Status: DC | PRN

## 2021-01-24 MED ORDER — ACETAMINOPHEN 160 MG/5ML PO SOLN: 1000.0000 mg | Freq: Once | ORAL | Status: DC | PRN

## 2021-01-24 MED ORDER — FENTANYL CITRATE (PF) 250 MCG/5ML IJ SOLN
INTRAMUSCULAR | Status: AC
  Filled 2021-01-24: qty 5

## 2021-01-24 MED ORDER — CEFAZOLIN SODIUM-DEXTROSE 2-4 GM/100ML-% IV SOLN
2.0000 g | INTRAVENOUS | Status: DC
  Filled 2021-01-24: qty 100

## 2021-01-24 MED ORDER — 0.9 % SODIUM CHLORIDE (POUR BTL) OPTIME
TOPICAL | Status: DC | PRN
  Administered 2021-01-24: 1000 mL

## 2021-01-24 MED ORDER — DEXAMETHASONE SODIUM PHOSPHATE 4 MG/ML IJ SOLN
INTRAMUSCULAR | Status: DC | PRN
  Administered 2021-01-24: 4 mg via INTRAVENOUS

## 2021-01-24 MED ORDER — FENTANYL CITRATE (PF) 100 MCG/2ML IJ SOLN: 25.0000 ug | INTRAMUSCULAR | Status: DC | PRN

## 2021-01-24 MED ORDER — LIDOCAINE-EPINEPHRINE 1 %-1:100000 IJ SOLN
INTRAMUSCULAR | Status: AC
  Filled 2021-01-24: qty 1

## 2021-01-24 MED ORDER — MIDAZOLAM HCL 5 MG/5ML IJ SOLN
INTRAMUSCULAR | Status: DC | PRN
Start: 2021-01-24 — End: 2021-01-24
  Administered 2021-01-24: 2 mg via INTRAVENOUS

## 2021-01-24 MED ORDER — BACITRACIN-NEOMYCIN-POLYMYXIN 400-5-5000 EX OINT
TOPICAL_OINTMENT | CUTANEOUS | Status: DC | PRN
Start: 2021-01-24 — End: 2021-01-24
  Administered 2021-01-24: 1 via TOPICAL

## 2021-01-24 MED ORDER — OXYCODONE HCL 5 MG PO TABS: 5.0000 mg | ORAL_TABLET | Freq: Once | ORAL | Status: DC | PRN

## 2021-01-24 SURGICAL SUPPLY — 37 items
BAG COUNTER SPONGE SURGICOUNT (BAG) ×3 IMPLANT
BAG SPNG CNTER NS LX DISP (BAG) ×1
CANISTER SUCT 3000ML PPV (MISCELLANEOUS) ×1 IMPLANT
CNTNR URN SCR LID CUP LEK RST (MISCELLANEOUS) ×2 IMPLANT
CONT SPEC 4OZ STRL OR WHT (MISCELLANEOUS) ×2
COVER SURGICAL LIGHT HANDLE (MISCELLANEOUS) ×3 IMPLANT
ELECT COATED BLADE 2.86 ST (ELECTRODE) ×3 IMPLANT
ELECT NDL BLADE 2-5/6 (NEEDLE) IMPLANT
ELECT NEEDLE BLADE 2-5/6 (NEEDLE) IMPLANT
ELECT REM PT RETURN 9FT ADLT (ELECTROSURGICAL) ×2
ELECTRODE REM PT RTRN 9FT ADLT (ELECTROSURGICAL) ×2 IMPLANT
GAUZE 4X4 16PLY ~~LOC~~+RFID DBL (SPONGE) ×1 IMPLANT
GAUZE SPONGE 4X4 12PLY STRL (GAUZE/BANDAGES/DRESSINGS) IMPLANT
GLOVE SURG ENC MOIS LTX SZ7.5 (GLOVE) ×3 IMPLANT
GOWN STRL REUS W/ TWL LRG LVL3 (GOWN DISPOSABLE) ×2 IMPLANT
GOWN STRL REUS W/ TWL XL LVL3 (GOWN DISPOSABLE) ×2 IMPLANT
GOWN STRL REUS W/TWL LRG LVL3 (GOWN DISPOSABLE) ×2
GOWN STRL REUS W/TWL XL LVL3 (GOWN DISPOSABLE) ×2
KIT BASIN OR (CUSTOM PROCEDURE TRAY) ×3 IMPLANT
KIT TURNOVER KIT B (KITS) ×3 IMPLANT
NDL 25GX 5/8IN NON SAFETY (NEEDLE) ×2 IMPLANT
NDL 27GX1/2 REG BEVEL ECLIP (NEEDLE) ×2 IMPLANT
NEEDLE 25GX 5/8IN NON SAFETY (NEEDLE) ×2 IMPLANT
NEEDLE 27GX1/2 REG BEVEL ECLIP (NEEDLE) ×2 IMPLANT
NS IRRIG 1000ML POUR BTL (IV SOLUTION) ×3 IMPLANT
PACK GENERAL/GYN (CUSTOM PROCEDURE TRAY) ×1 IMPLANT
PAD ARMBOARD 7.5X6 YLW CONV (MISCELLANEOUS) ×6 IMPLANT
PENCIL SMOKE EVACUATOR (MISCELLANEOUS) ×3 IMPLANT
SUT CHROMIC 4 0 PS 2 18 (SUTURE) IMPLANT
SUT MON AB 4-0 PC3 18 (SUTURE) IMPLANT
SUT PROLENE 5 0 P 3 (SUTURE) ×1 IMPLANT
SWAB COLLECTION DEVICE MRSA (MISCELLANEOUS) IMPLANT
SYR BULB IRRIG 60ML STRL (SYRINGE) ×3 IMPLANT
SYR CONTROL 10ML LL (SYRINGE) ×3 IMPLANT
TOWEL GREEN STERILE (TOWEL DISPOSABLE) ×3 IMPLANT
TOWEL GREEN STERILE FF (TOWEL DISPOSABLE) ×3 IMPLANT
YANKAUER SUCT BULB TIP NO VENT (SUCTIONS) ×3 IMPLANT

## 2021-01-24 NOTE — Transfer of Care (Signed)
Immediate Anesthesia Transfer of Care Note  Patient: Kendra Gutierrez  Procedure(s) Performed: LESION EXCISION LEFT EARLOBE (Left: Ear)  Patient Location: PACU  Anesthesia Type:General  Level of Consciousness: awake, alert  and oriented  Airway & Oxygen Therapy: Patient Spontanous Breathing  Post-op Assessment: Report given to RN, Post -op Vital signs reviewed and stable and Patient moving all extremities  Post vital signs: Reviewed and stable  Last Vitals:  Vitals Value Taken Time  BP    Temp    Pulse 90 01/24/21 0931  Resp 12 01/24/21 0931  SpO2 97 % 01/24/21 0931  Vitals shown include unvalidated device data.  Last Pain:  Vitals:   01/24/21 0830  TempSrc:   PainSc: 0-No pain         Complications: No notable events documented.

## 2021-01-24 NOTE — Anesthesia Procedure Notes (Signed)
Procedure Name: LMA Insertion Date/Time: 01/24/2021 8:54 AM Performed by: Amadeo Garnet, CRNA Pre-anesthesia Checklist: Patient identified, Emergency Drugs available, Suction available and Patient being monitored Patient Re-evaluated:Patient Re-evaluated prior to induction Oxygen Delivery Method: Circle system utilized Preoxygenation: Pre-oxygenation with 100% oxygen Induction Type: IV induction Ventilation: Mask ventilation without difficulty LMA: LMA inserted LMA Size: 4.0 Placement Confirmation: positive ETCO2 and breath sounds checked- equal and bilateral Dental Injury: Teeth and Oropharynx as per pre-operative assessment

## 2021-01-24 NOTE — Anesthesia Preprocedure Evaluation (Signed)
Anesthesia Evaluation  Patient identified by MRN, date of birth, ID band Patient awake    Reviewed: Allergy & Precautions, NPO status , Patient's Chart, lab work & pertinent test results  History of Anesthesia Complications (+) PONV and history of anesthetic complications  Airway Mallampati: I  TM Distance: >3 FB Neck ROM: Full    Dental  (+) Teeth Intact, Dental Advisory Given   Pulmonary neg shortness of breath, neg sleep apnea, neg COPD, neg recent URI,    breath sounds clear to auscultation       Cardiovascular negative cardio ROS   Rhythm:Regular     Neuro/Psych negative neurological ROS  negative psych ROS   GI/Hepatic negative GI ROS, Neg liver ROS,   Endo/Other  negative endocrine ROS  Renal/GU negative Renal ROS     Musculoskeletal negative musculoskeletal ROS (+)   Abdominal   Peds  Hematology  (+) anemia , Lab Results      Component                Value               Date                      WBC                      13.8 (H)            05/02/2016                HGB                      10.2 (L)            05/02/2016                HCT                      30.9 (L)            05/02/2016                MCV                      85.4                05/02/2016                PLT                      323                 05/02/2016              Anesthesia Other Findings   Reproductive/Obstetrics                             Anesthesia Physical Anesthesia Plan  ASA: 1  Anesthesia Plan: General   Post-op Pain Management:    Induction: Intravenous  PONV Risk Score and Plan: 4 or greater and Ondansetron, Dexamethasone, Midazolam and Treatment may vary due to age or medical condition  Airway Management Planned: LMA and Oral ETT  Additional Equipment: None  Intra-op Plan:   Post-operative Plan: Extubation in OR  Informed Consent: I have reviewed the patients History and  Physical, chart, labs and discussed the procedure including the risks, benefits and  alternatives for the proposed anesthesia with the patient or authorized representative who has indicated his/her understanding and acceptance.     Dental advisory given  Plan Discussed with: CRNA and Anesthesiologist  Anesthesia Plan Comments:         Anesthesia Quick Evaluation

## 2021-01-24 NOTE — Interval H&P Note (Signed)
History and Physical Interval Note:  01/24/2021 8:32 AM  Kendra Gutierrez  has presented today for surgery, with the diagnosis of Neoplasm, uncertain whether benign or malignant.  The various methods of treatment have been discussed with the patient and family. After consideration of risks, benefits and other options for treatment, the patient has consented to  Procedure(s): LESION EXCISION LEFT EARLOBE (Left) as a surgical intervention.  The patient's history has been reviewed, patient examined, no change in status, stable for surgery.  I have reviewed the patient's chart and labs.  Questions were answered to the patient's satisfaction.     Lennice Sites

## 2021-01-24 NOTE — Op Note (Signed)
Operative Note   DATE OF OPERATION: 01/24/2021  SURGICAL DEPARTMENT: Plastic Surgery  PREOPERATIVE DIAGNOSES: Left ear vascular lesion.  POSTOPERATIVE DIAGNOSES:  same  PROCEDURE:   1) excision left ear vascular lesion 1 cm 2) 1.2 cm complex closure left ear with wide local undermining  SURGEON: Sarp Vernier P. Manveer Gomes, MD  ASSISTANT: None.  ANESTHESIA:  General.   COMPLICATIONS: None.   INDICATIONS FOR PROCEDURE:  The patient, Kendra Gutierrez is a 56 y.o. female born on 12-Oct-1965, is here for treatment of left ear vascular lesion. MRN: [id number]  CONSENT:  Informed consent was obtained directly from the patient. Risks, benefits and alternatives were fully discussed. Specific risks including but not limited to bleeding, infection, hematoma, seroma, scarring, pain, contracture, asymmetry, wound healing problems, and need for further surgery were all discussed. The patient did have an ample opportunity to have questions answered to satisfaction.   DESCRIPTION OF PROCEDURE:  The patient was taken to the operating room. SCDs were placed and antibiotics were given.  General anesthesia was administered.  The patient's operative site was prepped and draped in a sterile fashion. A time out was performed and all information was confirmed to be correct.    The patient was taken to the operating room laid in the supine position.  The left ear was prepped and draped in a standard sterile fashion.  1% lidocaine with epinephrine was injected in the left earlobe.  A 15 blade was used to make a skin incision and the scissors were used for sharp and blunt dissection to isolate a vascular lesion of the left ear.  A bluish vascular lesion was removed and passed off the table and sent to pathology.  Of electrocautery was then used for hemostasis.  The lesion was undermined so that the total area undermined was greater than the length of the wound.  The wound was then closed with 5-0 Prolene.  The patient tolerated  the procedure well.  There were no complications. The patient was allowed to wake from anesthesia, extubated and taken to the recovery room in satisfactory condition.

## 2021-01-25 ENCOUNTER — Encounter (HOSPITAL_COMMUNITY): Payer: Self-pay | Admitting: Plastic Surgery

## 2021-01-25 LAB — SURGICAL PATHOLOGY

## 2021-01-25 NOTE — Anesthesia Postprocedure Evaluation (Signed)
Anesthesia Post Note  Patient: Kendra Gutierrez  Procedure(s) Performed: LESION EXCISION LEFT EARLOBE (Left: Ear)     Patient location during evaluation: PACU Anesthesia Type: General Level of consciousness: awake and alert Pain management: pain level controlled Vital Signs Assessment: post-procedure vital signs reviewed and stable Respiratory status: spontaneous breathing, nonlabored ventilation, respiratory function stable and patient connected to nasal cannula oxygen Cardiovascular status: blood pressure returned to baseline and stable Postop Assessment: no apparent nausea or vomiting Anesthetic complications: no   No notable events documented.  Last Vitals:  Vitals:   01/24/21 0945 01/24/21 1000  BP: (!) 146/88 140/86  Pulse: 76 77  Resp: 15 14  Temp:  36.5 C  SpO2: 99% 100%    Last Pain:  Vitals:   01/24/21 1000  TempSrc:   PainSc: 0-No pain   Pain Goal:                   Kristien Salatino

## 2021-01-30 ENCOUNTER — Ambulatory Visit (INDEPENDENT_AMBULATORY_CARE_PROVIDER_SITE_OTHER): Payer: BC Managed Care – PPO | Admitting: Plastic Surgery

## 2021-01-30 ENCOUNTER — Other Ambulatory Visit: Payer: Self-pay

## 2021-01-30 DIAGNOSIS — D1809 Hemangioma of other sites: Secondary | ICD-10-CM

## 2021-01-31 NOTE — Progress Notes (Signed)
Patient is status post left earlobe vascular lesion excision.  She is satisfied the result.  Physical exam Incisions clean dry and intact with good cosmesis.  Pathology: Hemangioma  Assessment and plan Sutures removed and the patient will follow-up as needed.

## 2023-06-25 ENCOUNTER — Other Ambulatory Visit: Payer: Self-pay | Admitting: Obstetrics and Gynecology

## 2023-06-25 DIAGNOSIS — R928 Other abnormal and inconclusive findings on diagnostic imaging of breast: Secondary | ICD-10-CM

## 2023-07-03 ENCOUNTER — Ambulatory Visit
Admission: RE | Admit: 2023-07-03 | Discharge: 2023-07-03 | Disposition: A | Discharge status: Home / Self Care | Source: Ambulatory Visit | Attending: Obstetrics and Gynecology | Admitting: Obstetrics and Gynecology

## 2023-07-03 ENCOUNTER — Encounter

## 2023-07-03 DIAGNOSIS — R928 Other abnormal and inconclusive findings on diagnostic imaging of breast: Secondary | ICD-10-CM

## 2023-07-04 ENCOUNTER — Other Ambulatory Visit: Payer: Self-pay | Admitting: Obstetrics and Gynecology

## 2023-07-04 DIAGNOSIS — R921 Mammographic calcification found on diagnostic imaging of breast: Secondary | ICD-10-CM

## 2023-07-11 ENCOUNTER — Ambulatory Visit
Admission: RE | Admit: 2023-07-11 | Discharge: 2023-07-11 | Disposition: A | Discharge status: Home / Self Care | Source: Ambulatory Visit | Attending: Obstetrics and Gynecology | Admitting: Obstetrics and Gynecology

## 2023-07-11 DIAGNOSIS — R921 Mammographic calcification found on diagnostic imaging of breast: Secondary | ICD-10-CM

## 2023-07-15 LAB — SURGICAL PATHOLOGY
